# Patient Record
Sex: Male | Born: 1970 | Race: Black or African American | Hispanic: No | Marital: Single | Smoking: Current every day smoker
Health system: Southern US, Community
[De-identification: ages and names within clinical notes are randomized; demographics above are authoritative.]

## PROBLEM LIST (undated history)

## (undated) ENCOUNTER — Emergency Department: Payer: Medicare HMO

## (undated) DIAGNOSIS — I10 Essential (primary) hypertension: Secondary | ICD-10-CM

## (undated) DIAGNOSIS — W3400XA Accidental discharge from unspecified firearms or gun, initial encounter: Secondary | ICD-10-CM

---

## 2002-05-26 DIAGNOSIS — W3400XA Accidental discharge from unspecified firearms or gun, initial encounter: Secondary | ICD-10-CM

## 2002-05-26 DIAGNOSIS — Y249XXA Unspecified firearm discharge, undetermined intent, initial encounter: Secondary | ICD-10-CM

## 2002-05-26 HISTORY — PX: OTHER SURGICAL HISTORY: SHX169

## 2002-05-26 HISTORY — DX: Unspecified firearm discharge, undetermined intent, initial encounter: Y24.9XXA

## 2002-05-26 HISTORY — DX: Accidental discharge from unspecified firearms or gun, initial encounter: W34.00XA

## 2007-02-07 ENCOUNTER — Emergency Department: Payer: Self-pay | Admitting: Internal Medicine

## 2007-02-10 ENCOUNTER — Emergency Department: Payer: Self-pay | Admitting: Emergency Medicine

## 2009-04-26 ENCOUNTER — Ambulatory Visit: Payer: Self-pay | Admitting: Internal Medicine

## 2010-10-07 ENCOUNTER — Emergency Department: Payer: Self-pay | Admitting: Emergency Medicine

## 2011-01-07 ENCOUNTER — Emergency Department: Payer: Self-pay | Admitting: Emergency Medicine

## 2011-09-27 ENCOUNTER — Inpatient Hospital Stay: Payer: Self-pay | Admitting: Psychiatry

## 2011-09-27 LAB — COMPREHENSIVE METABOLIC PANEL
Albumin: 3.8 g/dL (ref 3.4–5.0)
Alkaline Phosphatase: 57 U/L (ref 50–136)
Anion Gap: 4 — ABNORMAL LOW (ref 7–16)
BUN: 11 mg/dL (ref 7–18)
Chloride: 106 mmol/L (ref 98–107)
Creatinine: 1 mg/dL (ref 0.60–1.30)
EGFR (Non-African Amer.): >60
Osmolality: 275 (ref 275–301)
SGOT(AST): 16 U/L (ref 15–37)
Sodium: 138 mmol/L (ref 136–145)
Total Protein: 7.2 g/dL (ref 6.4–8.2)

## 2011-09-27 LAB — DRUG SCREEN, URINE
Amphetamines, Ur Screen: NEGATIVE (ref ?–1000)
Barbiturates, Ur Screen: NEGATIVE (ref ?–200)
Benzodiazepine, Ur Scrn: NEGATIVE (ref ?–200)
Cannabinoid 50 Ng, Ur ~~LOC~~: NEGATIVE (ref ?–50)
Cocaine Metabolite,Ur ~~LOC~~: POSITIVE (ref ?–300)
MDMA (Ecstasy)Ur Screen: NEGATIVE (ref ?–500)
Opiate, Ur Screen: NEGATIVE (ref ?–300)

## 2011-09-27 LAB — SALICYLATE LEVEL: Salicylates, Serum: <1.7 mg/dL

## 2011-09-27 LAB — CBC
HGB: 14.3 g/dL (ref 13.0–18.0)
MCV: 92 fL (ref 80–100)
Platelet: 228 10*3/uL (ref 150–440)
RDW: 14 % (ref 11.5–14.5)

## 2011-09-27 LAB — ETHANOL: Ethanol %: <0.003 % (ref 0.000–0.080)

## 2011-09-27 LAB — ACETAMINOPHEN LEVEL: Acetaminophen: <2 ug/mL

## 2012-06-22 ENCOUNTER — Observation Stay: Payer: Self-pay | Admitting: Internal Medicine

## 2012-06-22 LAB — CBC
MCHC: 33.6 g/dL (ref 32.0–36.0)
MCV: 91 fL (ref 80–100)
Platelet: 245 10*3/uL (ref 150–440)
RBC: 5.01 10*6/uL (ref 4.40–5.90)
RDW: 13.5 % (ref 11.5–14.5)

## 2012-06-22 LAB — DRUG SCREEN, URINE
Benzodiazepine, Ur Scrn: NEGATIVE (ref ?–200)
Cannabinoid 50 Ng, Ur ~~LOC~~: NEGATIVE (ref ?–50)
Cocaine Metabolite,Ur ~~LOC~~: NEGATIVE (ref ?–300)
MDMA (Ecstasy)Ur Screen: NEGATIVE (ref ?–500)
Methadone, Ur Screen: NEGATIVE (ref ?–300)
Opiate, Ur Screen: NEGATIVE (ref ?–300)

## 2012-06-22 LAB — BASIC METABOLIC PANEL
Calcium, Total: 9.3 mg/dL (ref 8.5–10.1)
Chloride: 107 mmol/L (ref 98–107)
Co2: 30 mmol/L (ref 21–32)
EGFR (Non-African Amer.): >60
Glucose: 89 mg/dL (ref 65–99)
Osmolality: 280 (ref 275–301)

## 2013-05-21 IMAGING — CR DG SHOULDER 3+V*L*
1 series · 3 of 3 positions shown · non-contrast
Comparison: none

REASON FOR EXAM: pain lrft shoulder
COMMENTS:

PROCEDURE:     DXR - DXR SHOULDER LEFT COMPLETE  - September 27, 2011  [DATE]
RESULT:     Three views of the left shoulder are submitted. The bones appear
adequately mineralized. I see no evidence of fracture nor dislocation. The
overlying soft tissues are normal in appearance.

[Series 1: internal rotate · 0.17mm/px · 3 of 3 slices shown]
[im 1/3]
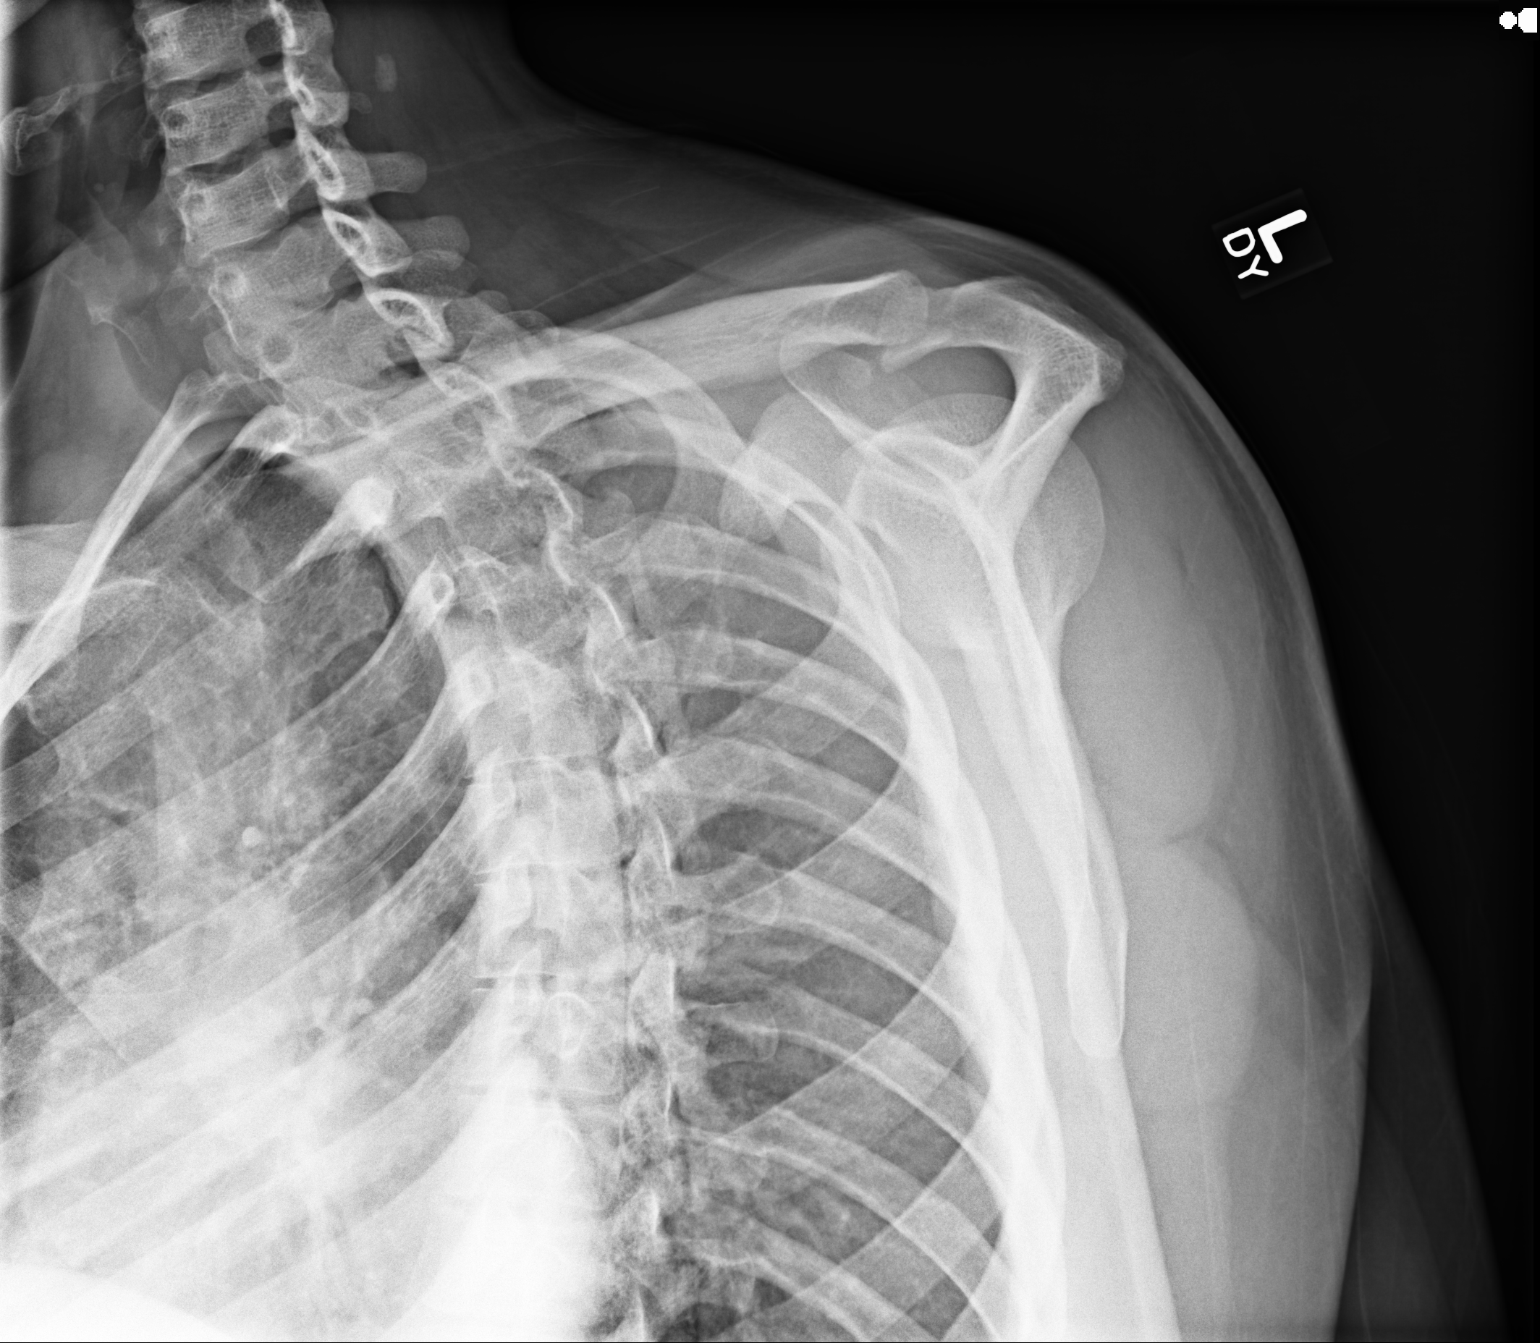
[im 2/3]
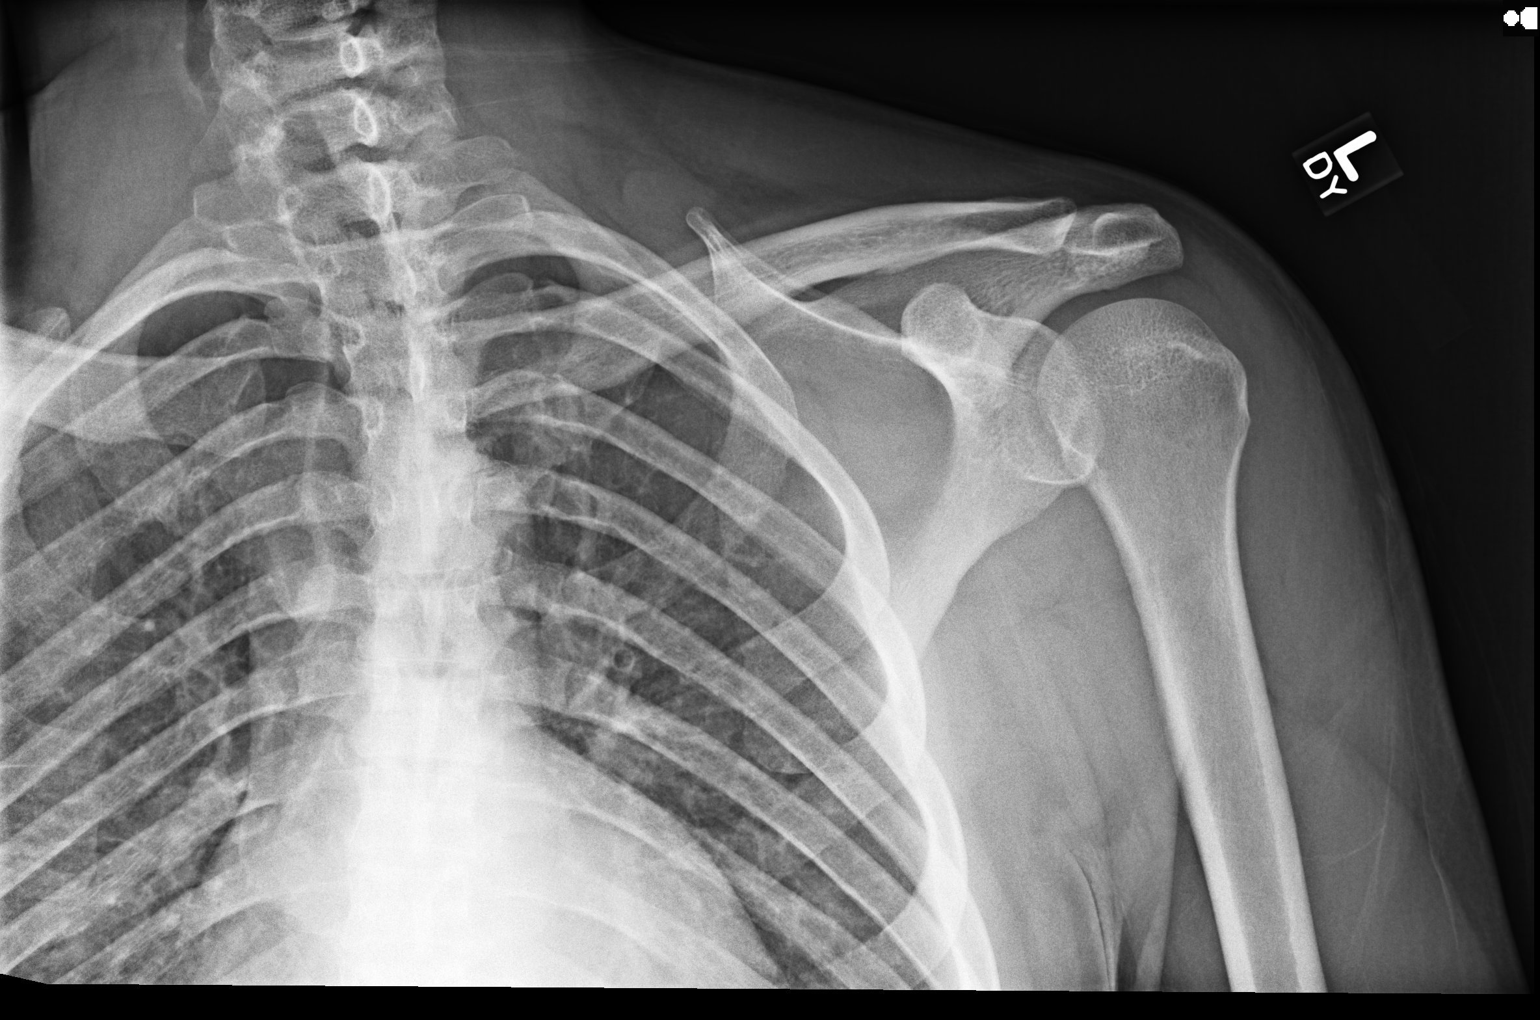
[im 3/3]
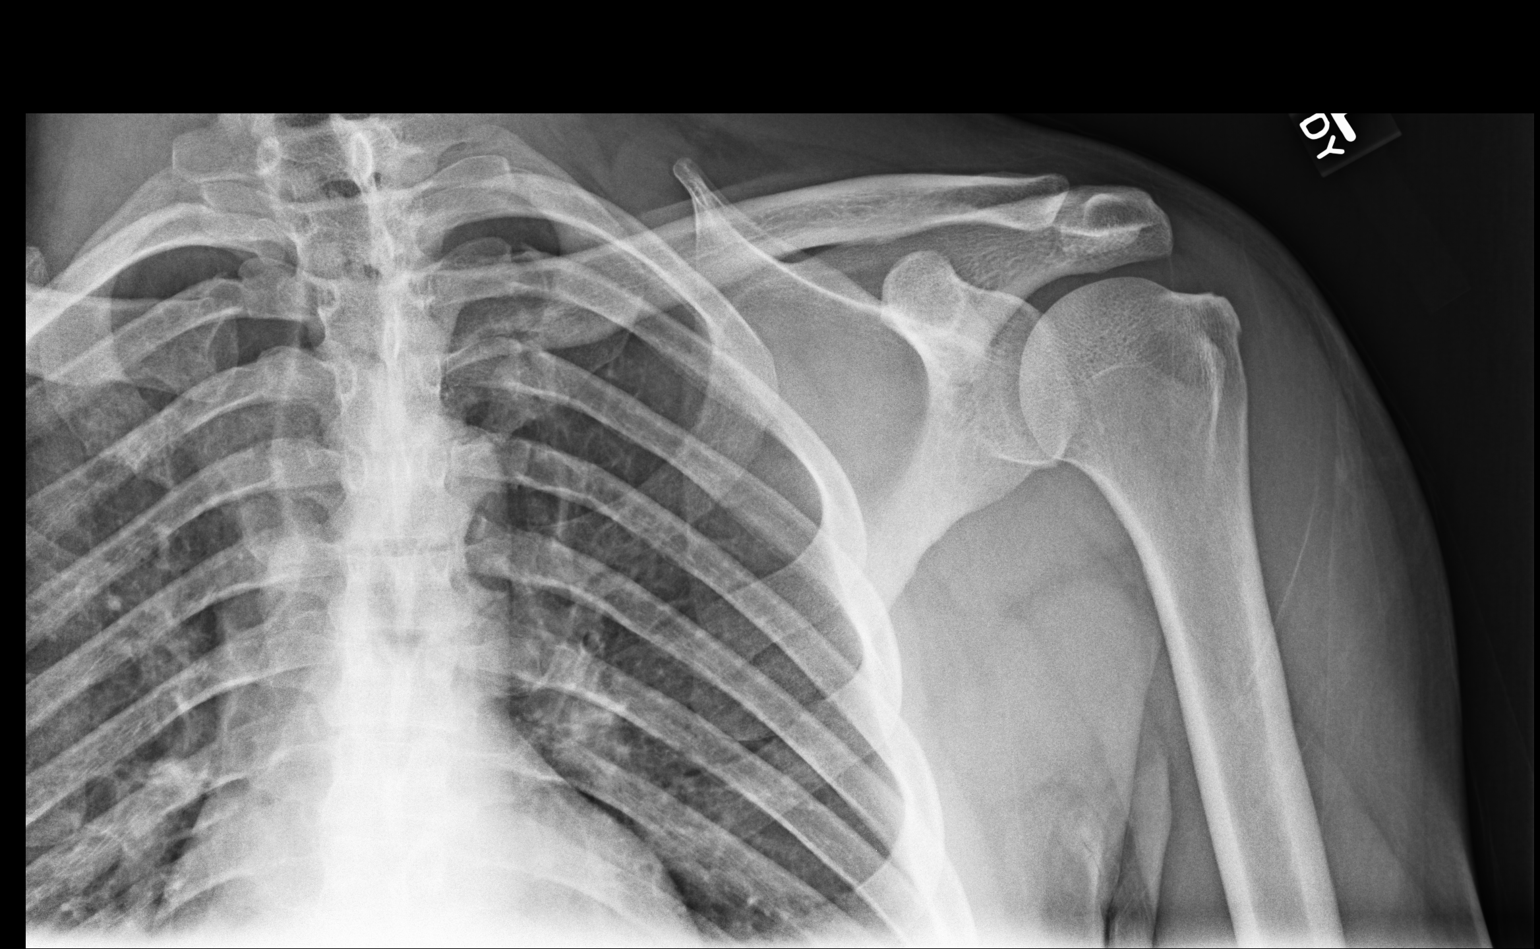

[3 of 3 positions shown; findings below may reference images not displayed]

IMPRESSION: I see no acute bony abnormality of the left shoulder.

[REDACTED]

## 2014-02-14 IMAGING — CT CT HEAD WITHOUT CONTRAST
1 series · 16 of 30 positions shown, 20 images · non-contrast
Comparison: none

REASON FOR EXAM: l sided numbness
COMMENTS:

PROCEDURE:     CT  - CT HEAD WITHOUT CONTRAST  - June 22, 2012  [DATE]
RESULT:     Comparison:  None
TECHNIQUE: Multiple axial images from the foramen magnum to the vertex were
obtained without IV contrast.

[Series 2: soft tissue · axial · 0.41mm/px · z∈[-139,-4]mm · 16 of 31 slices shown, 20 images]
[im 2/31  brain]
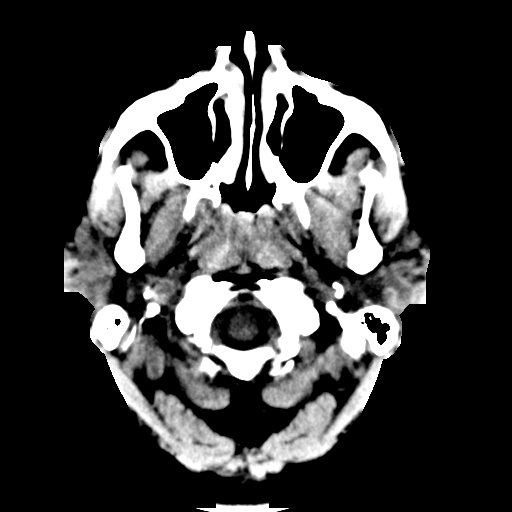
[im 2/31  bone]
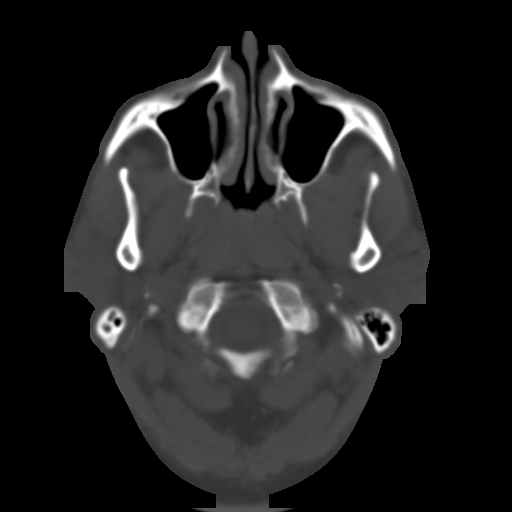
[im 4/31  brain]
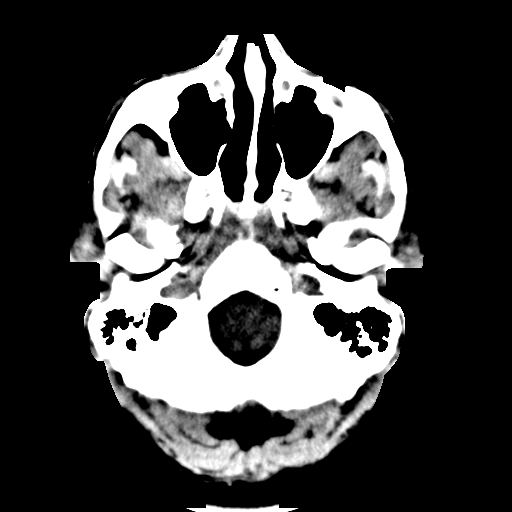
[im 6/31  brain]
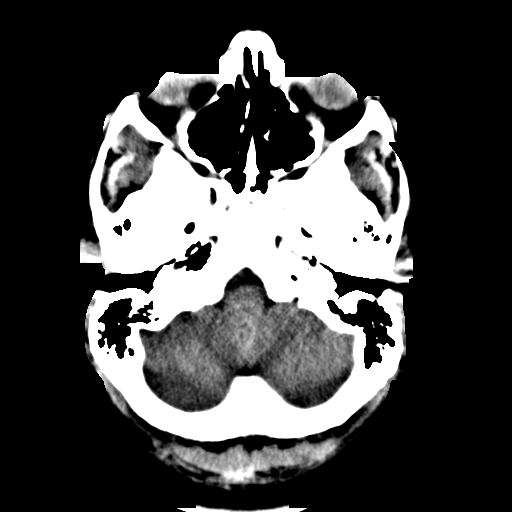
[im 8/31  brain]
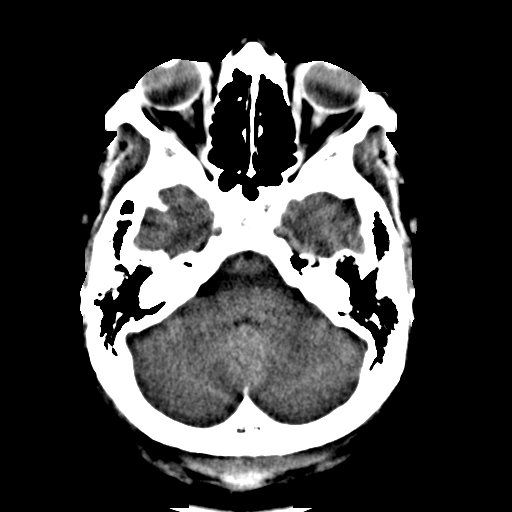
[im 9/31  brain]
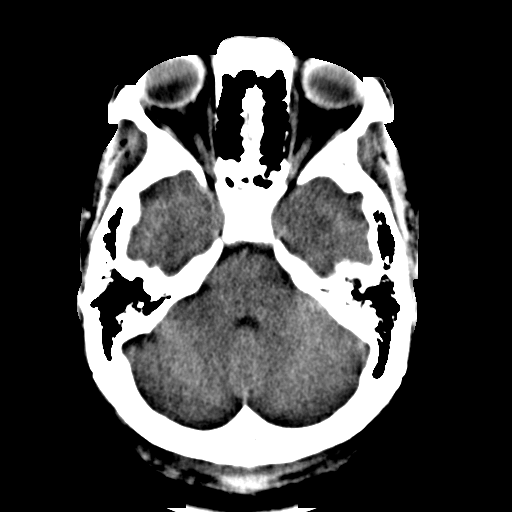
[im 9/31  bone]
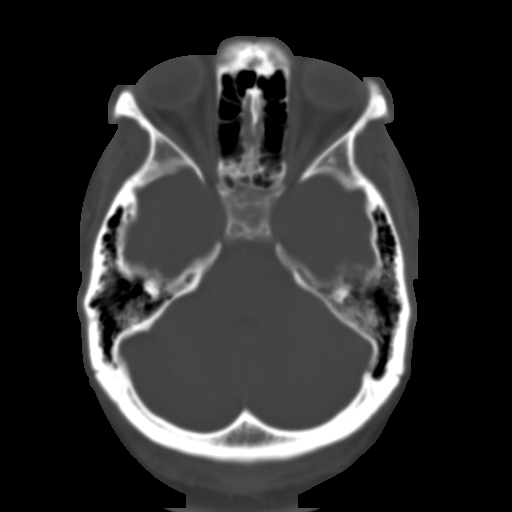
[im 11/31  brain]
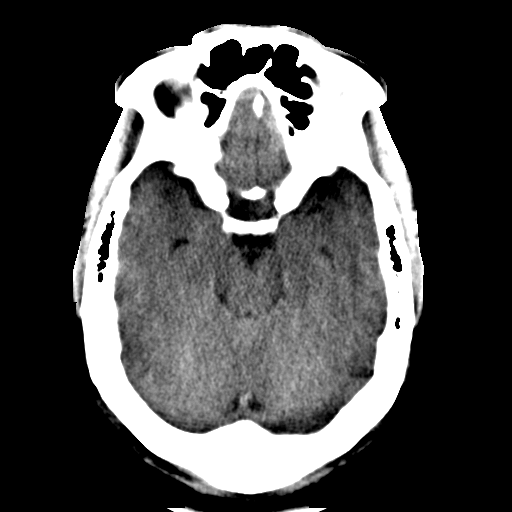
[im 13/31  brain]
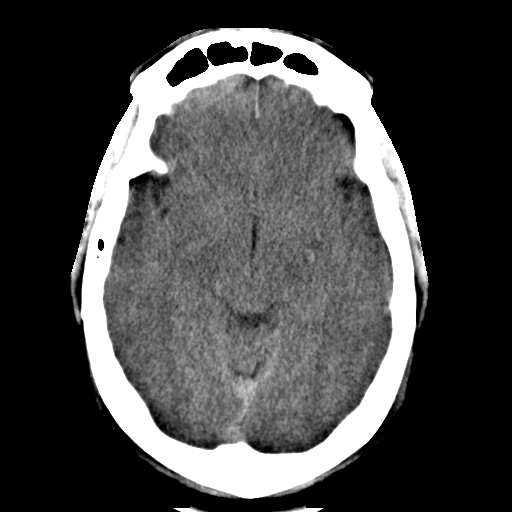
[im 15/31  brain]
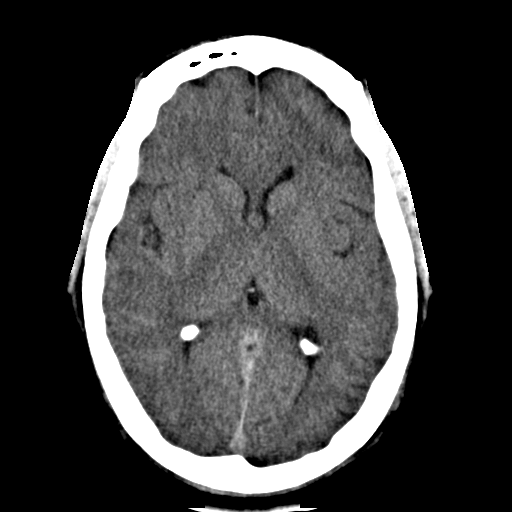
[im 16/31  brain]
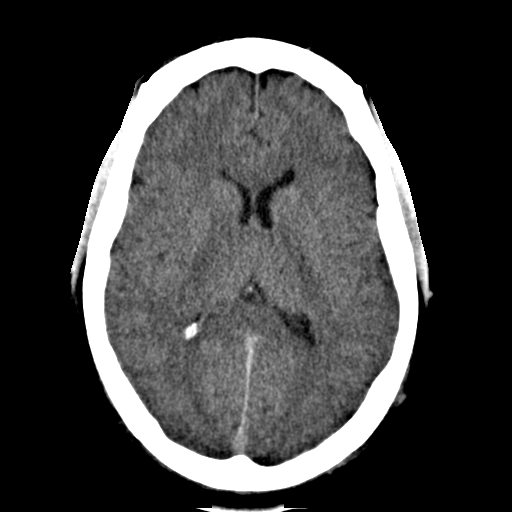
[im 16/31  bone]
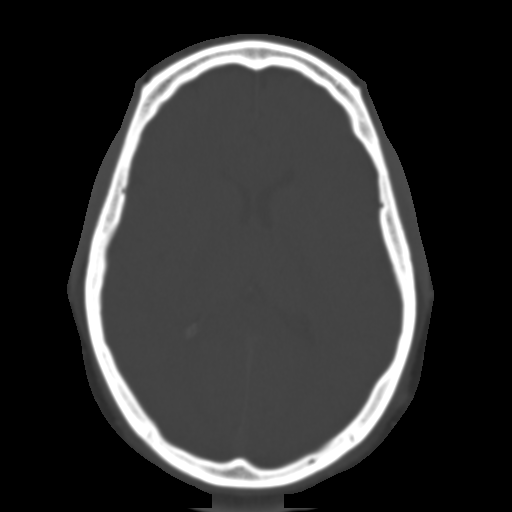
[im 18/31  brain]
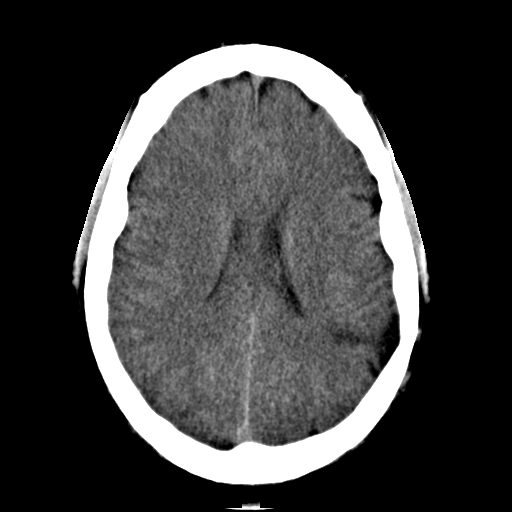
[im 20/31  brain]
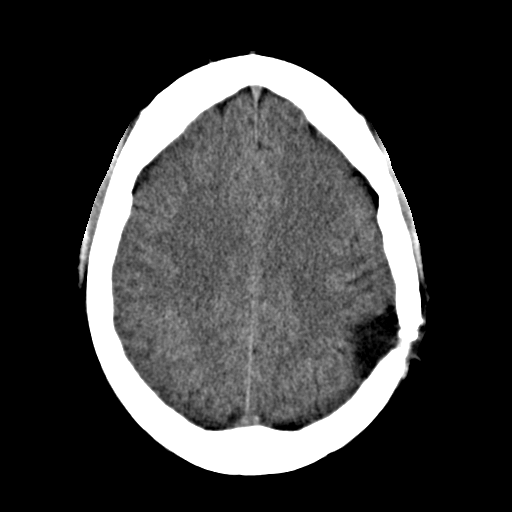
[im 22/31  brain]
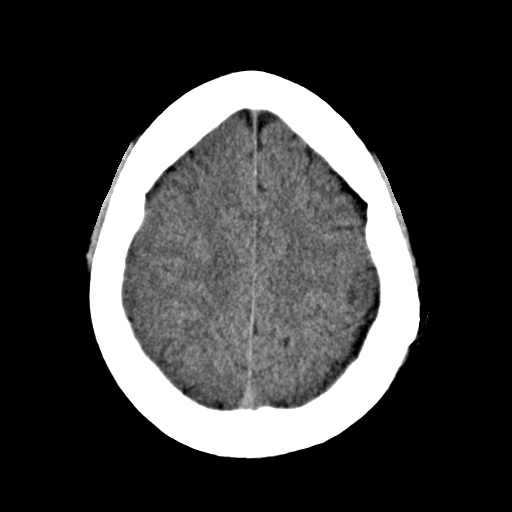
[im 23/31  brain]
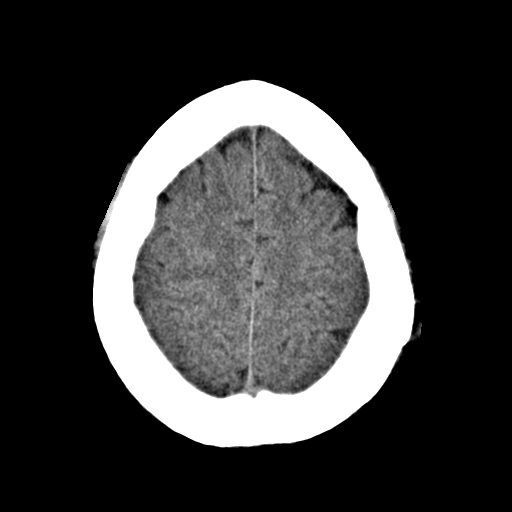
[im 23/31  bone]
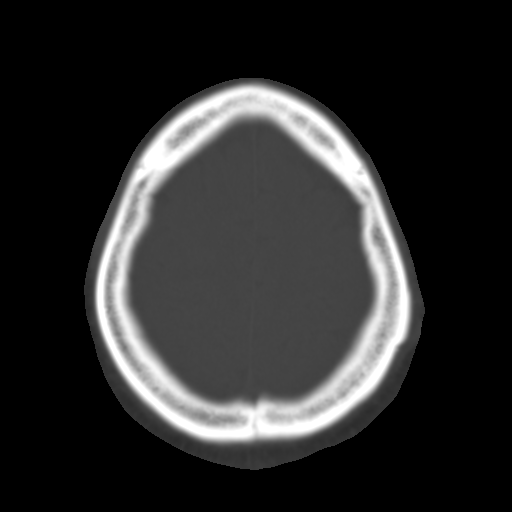
[im 25/31  brain]
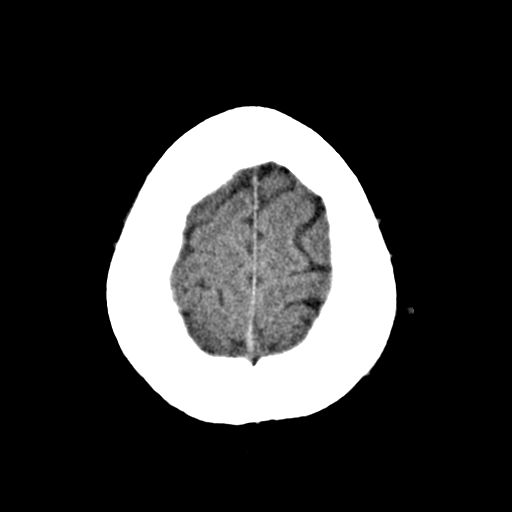
[im 27/31  brain]
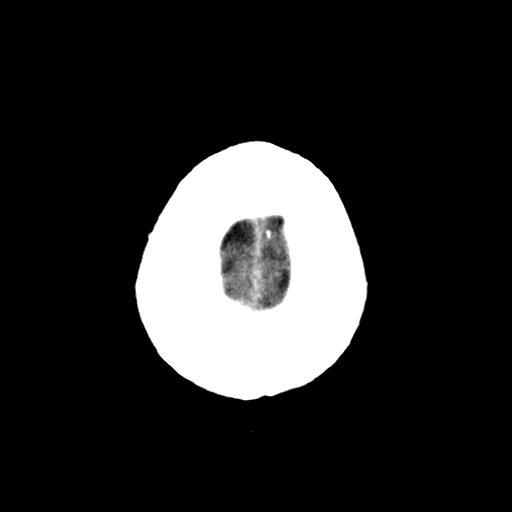
[im 29/31  brain]
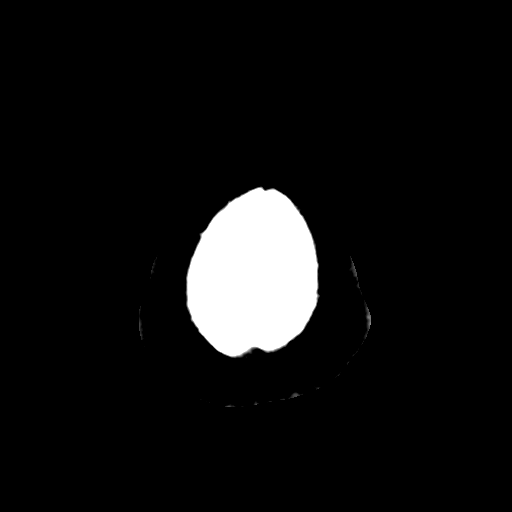

[16 of 30 positions shown; findings below may reference images not displayed]

FINDINGS: There is no evidence of mass effect, midline shift, or extra-axial fluid
collections.  There is no evidence of a space-occupying lesion or
intracranial hemorrhage. There is no evidence of a cortical-based area of
acute infarction. There is left parietal encephalomalacia from prior insult.
There is a left parietal calvarial defect.

The ventricles and sulci are appropriate for the patient's age. The basal
cisterns are patent.

Visualized portions of the orbits are unremarkable. The visualized portions
of the paranasal sinuses and mastoid air cells are unremarkable.

The osseous structures are otherwise unremarkable.
IMPRESSION: No acute intracranial process.

[REDACTED]

## 2014-04-07 ENCOUNTER — Emergency Department: Payer: Self-pay | Admitting: Emergency Medicine
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
## 2014-10-09 ENCOUNTER — Other Ambulatory Visit: Payer: Self-pay

## 2014-10-09 ENCOUNTER — Emergency Department
Admission: EM | Admit: 2014-10-09 | Discharge: 2014-10-10 | Disposition: A | Discharge status: Home / Self Care | Payer: Medicare Other | Attending: Emergency Medicine | Admitting: Emergency Medicine

## 2014-10-09 ENCOUNTER — Encounter: Payer: Self-pay | Admitting: Emergency Medicine

## 2014-10-09 DIAGNOSIS — W1830XA Fall on same level, unspecified, initial encounter: Secondary | ICD-10-CM | POA: Insufficient documentation

## 2014-10-09 DIAGNOSIS — Z720 Tobacco use: Secondary | ICD-10-CM | POA: Diagnosis not present

## 2014-10-09 DIAGNOSIS — Y9301 Activity, walking, marching and hiking: Secondary | ICD-10-CM | POA: Diagnosis not present

## 2014-10-09 DIAGNOSIS — S8991XA Unspecified injury of right lower leg, initial encounter: Secondary | ICD-10-CM | POA: Insufficient documentation

## 2014-10-09 DIAGNOSIS — I10 Essential (primary) hypertension: Secondary | ICD-10-CM | POA: Diagnosis not present

## 2014-10-09 DIAGNOSIS — R252 Cramp and spasm: Secondary | ICD-10-CM

## 2014-10-09 DIAGNOSIS — Y998 Other external cause status: Secondary | ICD-10-CM | POA: Insufficient documentation

## 2014-10-09 DIAGNOSIS — Z79899 Other long term (current) drug therapy: Secondary | ICD-10-CM | POA: Insufficient documentation

## 2014-10-09 DIAGNOSIS — Y9289 Other specified places as the place of occurrence of the external cause: Secondary | ICD-10-CM | POA: Insufficient documentation

## 2014-10-09 HISTORY — DX: Essential (primary) hypertension: I10

## 2014-10-09 HISTORY — DX: Accidental discharge from unspecified firearms or gun, initial encounter: W34.00XA

## 2014-10-09 LAB — BASIC METABOLIC PANEL
ANION GAP: 8 (ref 5–15)
BUN: 17 mg/dL (ref 6–20)
CALCIUM: 9.5 mg/dL (ref 8.9–10.3)
CO2: 29 mmol/L (ref 22–32)
CREATININE: 1.31 mg/dL — AB (ref 0.61–1.24)
Chloride: 105 mmol/L (ref 101–111)
GFR calc Af Amer: >60 mL/min (ref >60)
GFR calc non Af Amer: >60 mL/min (ref >60)
Glucose, Bld: 90 mg/dL (ref 65–99)
Potassium: 3.9 mmol/L (ref 3.5–5.1)
SODIUM: 142 mmol/L (ref 135–145)

## 2014-10-09 MED ORDER — IBUPROFEN 800 MG PO TABS
800.0000 mg | ORAL_TABLET | ORAL | Status: AC
  Administered 2014-10-09: 800 mg via ORAL

## 2014-10-09 MED ORDER — ACETAMINOPHEN 500 MG PO TABS
ORAL_TABLET | ORAL | Status: AC
  Administered 2014-10-09: 1000 mg via ORAL
  Filled 2014-10-09: qty 2

## 2014-10-09 MED ORDER — IBUPROFEN 800 MG PO TABS
ORAL_TABLET | ORAL | Status: AC
  Administered 2014-10-09: 800 mg via ORAL
  Filled 2014-10-09: qty 1

## 2014-10-09 MED ORDER — LISINOPRIL 5 MG PO TABS: 5.0000 mg | ORAL_TABLET | Freq: Every day | ORAL | Status: DC

## 2014-10-09 MED ORDER — ACETAMINOPHEN 500 MG PO TABS
1000.0000 mg | ORAL_TABLET | ORAL | Status: AC
  Administered 2014-10-09: 1000 mg via ORAL

## 2014-10-09 NOTE — ED Notes (Addendum)
Pt arrived via EMS from home/site. Per EMS patient was walking and had a muscle spasm and fell. Per family, pt fell to the ground due to weakness. Pt alert and oriented on arrival.

## 2014-10-09 NOTE — Discharge Instructions (Signed)
Hypertension  Please follow up with the [facility]. Do not use any medications except those which are prescribed to you. Return to the ER right away should you have return of severe pain in the leg, weakness in the leg, if cold he walking, numbness, tingling, or other new concerns or symptoms arise.  Hypertension is another name for high blood pressure. High blood pressure forces your heart to work harder to pump blood. A blood pressure reading has two numbers, which includes a higher number over a lower number (example: 110/72). HOME CARE   Have your blood pressure rechecked by your doctor.  Only take medicine as told by your doctor. Follow the directions carefully. The medicine does not work as well if you skip doses. Skipping doses also puts you at risk for problems.  Do not smoke.  Monitor your blood pressure at home as told by your doctor. GET HELP IF:  You think you are having a reaction to the medicine you are taking.  You have repeat headaches or feel dizzy.  You have puffiness (swelling) in your ankles.  You have trouble with your vision. GET HELP RIGHT AWAY IF:   You get a very bad headache and are confused.  You feel weak, numb, or faint.  You get chest or belly (abdominal) pain.  You throw up (vomit).  You cannot breathe very well. MAKE SURE YOU:   Understand these instructions.  Will watch your condition.  Will get help right away if you are not doing well or get worse. Document Released: 10/29/2007 Document Revised: 05/17/2013 Document Reviewed: 03/04/2013 [facility] Patient Information 2015 [place], [place]. This information is not intended to replace advice given to you by your health care provider. Make sure you discuss any questions you have with your health care provider.  Leg Cramps Leg cramps that occur during exercise can be caused by poor circulation or dehydration. However, muscle cramps that occur at rest or during the night are usually not  due to any serious medical problem. Heat cramps may cause muscle spasms during hot weather.  CAUSES There is no clear cause for muscle cramps. However, dehydration may be a factor for those who do not drink enough fluids and those who exercise in the heat. Imbalances in the level of sodium, potassium, calcium or magnesium in the muscle tissue may also be a factor. Some medications, such as water pills (diuretics), may cause loss of chemicals that the body needs (like sodium and potassium) and cause muscle cramps. TREATMENT   Make sure your diet has enough fluids and essential minerals for the muscle to work normally.  Avoid strenuous exercise for several days if you have been having frequent leg cramps.  Stretch and massage the cramped muscle for several minutes.  Some medicines may be helpful in some patients with night cramps. Only take over-the-counter or prescription medicines as directed by your caregiver. SEEK IMMEDIATE MEDICAL CARE IF:   Your leg cramps become worse.  Your foot becomes cold, numb, or blue. Document Released: 06/19/2004 Document Revised: 08/04/2011 Document Reviewed: 06/06/2008 [facility] Patient Information 2015 [place], [place]. This information is not intended to replace advice given to you by your health care provider. Make sure you discuss any questions you have with your health care provider.
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
## 2014-10-10 NOTE — ED Provider Notes (Signed)
-----------------------------------------   12:49 AM on 10/10/2014 -----------------------------------------  Assuming care from Dr. Fanny BienQuale.  In short, Troy Wiley is a 44 y.o. male with a chief complaint of Weakness .  Refer to the original H&P for additional details.  The current plan of care is to follow-up the results of the patient's basic metabolic panel.  The patient has a creatinine of 1.31 but the remainder of his basic metabolic panel is unremarkable. I'll discharge the patient home to have him follow up with the primary care physician.   Rebecka ApleyAllison P Sharin Altidor, MD 10/10/14 [phone]

## 2018-01-27 ENCOUNTER — Emergency Department
Admission: EM | Admit: 2018-01-27 | Discharge: 2018-01-28 | Disposition: A | Discharge status: Home / Self Care | Payer: Medicare Other | Attending: Student in an Organized Health Care Education/Training Program | Admitting: Student in an Organized Health Care Education/Training Program

## 2018-01-27 ENCOUNTER — Other Ambulatory Visit: Payer: Self-pay

## 2018-01-27 DIAGNOSIS — F1410 Cocaine abuse, uncomplicated: Secondary | ICD-10-CM | POA: Diagnosis not present

## 2018-01-27 DIAGNOSIS — F329 Major depressive disorder, single episode, unspecified: Secondary | ICD-10-CM | POA: Insufficient documentation

## 2018-01-27 DIAGNOSIS — F17210 Nicotine dependence, cigarettes, uncomplicated: Secondary | ICD-10-CM | POA: Diagnosis not present

## 2018-01-27 DIAGNOSIS — I10 Essential (primary) hypertension: Secondary | ICD-10-CM | POA: Diagnosis not present

## 2018-01-27 DIAGNOSIS — G8929 Other chronic pain: Secondary | ICD-10-CM

## 2018-01-27 DIAGNOSIS — R45851 Suicidal ideations: Secondary | ICD-10-CM | POA: Diagnosis not present

## 2018-01-27 DIAGNOSIS — F1910 Other psychoactive substance abuse, uncomplicated: Secondary | ICD-10-CM | POA: Diagnosis not present

## 2018-01-27 LAB — COMPREHENSIVE METABOLIC PANEL
ALK PHOS: 71 U/L (ref 38–126)
ALT: 18 U/L (ref 0–44)
AST: 17 U/L (ref 15–41)
Albumin: 4.7 g/dL (ref 3.5–5.0)
Anion gap: 9 (ref 5–15)
BUN: 16 mg/dL (ref 6–20)
CALCIUM: 9.7 mg/dL (ref 8.9–10.3)
CO2: 26 mmol/L (ref 22–32)
CREATININE: 1.26 mg/dL — AB (ref 0.61–1.24)
Chloride: 104 mmol/L (ref 98–111)
GFR calc Af Amer: >60 mL/min (ref >60)
Glucose, Bld: 126 mg/dL — ABNORMAL HIGH (ref 70–99)
Potassium: 4 mmol/L (ref 3.5–5.1)
Sodium: 139 mmol/L (ref 135–145)
Total Bilirubin: 0.8 mg/dL (ref 0.3–1.2)
Total Protein: 8.3 g/dL — ABNORMAL HIGH (ref 6.5–8.1)

## 2018-01-27 LAB — CBC
HCT: 49.4 % (ref 40.0–52.0)
HEMOGLOBIN: 16.5 g/dL (ref 13.0–18.0)
MCH: 30.4 pg (ref 26.0–34.0)
MCHC: 33.4 g/dL (ref 32.0–36.0)
MCV: 90.8 fL (ref 80.0–100.0)
PLATELETS: 302 10*3/uL (ref 150–440)
RBC: 5.44 MIL/uL (ref 4.40–5.90)
RDW: 14.1 % (ref 11.5–14.5)
WBC: 11.6 10*3/uL — ABNORMAL HIGH (ref 3.8–10.6)

## 2018-01-27 LAB — ETHANOL

## 2018-01-27 LAB — SALICYLATE LEVEL: Salicylate Lvl: <7 mg/dL (ref 2.8–30.0)

## 2018-01-27 LAB — ACETAMINOPHEN LEVEL: Acetaminophen (Tylenol), Serum: <10 ug/mL — ABNORMAL LOW (ref 10–30)

## 2018-01-27 MED ORDER — CITALOPRAM HYDROBROMIDE 20 MG PO TABS
10.0000 mg | ORAL_TABLET | Freq: Every day | ORAL | Status: DC
  Administered 2018-01-28: 10 mg via ORAL
  Filled 2018-01-27: qty 1

## 2018-01-27 NOTE — ED Notes (Signed)
SOC called to [employer].

## 2018-01-27 NOTE — ED Notes (Signed)
Sister, Lelon Mast that is here with patient is very supportive and willing to help patient get detox treatment.

## 2018-01-27 NOTE — ED Triage Notes (Signed)
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 Having suicidal thoughts x 2 days. No plan, no hx of SI attempts. Denies HI.

## 2018-01-27 NOTE — ED Notes (Signed)
Pt given sandwich tray and drink. 

## 2018-01-27 NOTE — ED Notes (Signed)

## 2018-01-27 NOTE — ED Notes (Signed)
This RN and Dorian EDT at bedside while patient changes into hospital clothing.

## 2018-01-27 NOTE — ED Notes (Signed)
Hourly rounding reveals patient sleeping in room. No complaints, stable, in no acute distress. Q15 minute rounds and monitoring via Security Cameras to continue. 

## 2018-01-27 NOTE — ED Notes (Signed)
Pt. Transferred to [facility] from ED to room 3 after screening for contraband. Report to include Situation, Background, Assessment and Recommendations from Kenisha RN. Pt. Oriented to unit including Q15 minute rounds as well as the security cameras for their protection. Patient is alert and oriented, warm and dry in no acute distress. Patient denies SI, HI, and AVH. Pt. Encouraged to let me know if needs arise. 

## 2018-01-27 NOTE — ED Notes (Signed)
SOC camera in room.
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
## 2018-01-27 NOTE — ED Notes (Signed)
Tennis shoes, jean shorts, black shirt, green tank top, camo underwear, grey belt sent home with [phone] Peter Minium)

## 2018-01-27 NOTE — ED Notes (Signed)
Hourly rounding reveals patient in room. No complaints, stable, in no acute distress. Q15 minute rounds and monitoring via Security Cameras to continue. 

## 2018-01-28 DIAGNOSIS — G8929 Other chronic pain: Secondary | ICD-10-CM

## 2018-01-28 DIAGNOSIS — F1410 Cocaine abuse, uncomplicated: Secondary | ICD-10-CM

## 2018-01-28 NOTE — ED Notes (Signed)
Hourly rounding reveals patient sleeping in room. No complaints, stable, in no acute distress. Q15 minute rounds and monitoring via Security Cameras to continue. 
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
## 2018-01-28 NOTE — ED Notes (Signed)
IVC/Consult completed/ Pending placement 

## 2018-01-28 NOTE — ED Provider Notes (Signed)
-----------------------------------------   4:59 PM on 01/28/2018 -----------------------------------------   Blood pressure (!) 149/88, pulse 66, temperature 97.7 F (36.5 C), temperature source Oral, resp. rate 16, height 5\' 9"  (1.753 m), weight 109 kg, SpO2 100 %.  The patient had no acute events since last update.  Calm and cooperative at this time.  Patient clinically sober.  No suicidal or homicidal ideation at this time.  Evaluated by Dr. Toni Amend and deemed appropriate for discharge home with follow-up with [facility].    Myrna Blazer, MD 01/28/18 [phone]

## 2018-01-28 NOTE — ED Provider Notes (Signed)
-----------------------------------------   6:42 AM on 01/28/2018 -----------------------------------------   Blood pressure (!) 149/88, pulse 66, temperature 97.7 F (36.5 C), temperature source Oral, resp. rate 16, height 5\' 9"  (1.753 m), weight 109 kg, SpO2 100 %.  The patient had no acute events since last update.  Calm and cooperative at this time.  Disposition is pending Psychiatry/Behavioral Medicine team recommendations.     Irean Hong, MD 01/28/18 [phone]

## 2018-01-28 NOTE — ED Notes (Addendum)
Patient discharged home, patient received discharge papers. Patient received belongings and verbalized she has received all of her belongings. Patient appropriate and cooperative, Denies SI/HI AVH. Vital signs taken. NAD noted. 
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
## 2018-01-28 NOTE — BH Assessment (Signed)
Per [facility] pt meets inpatient criteria. TTS consult ordered at 2:24am, pt sleeping comfortably. TTS will return to complete assessment when pt is awake and appropriate to complete assessment.

## 2018-09-24 DIAGNOSIS — G44329 Chronic post-traumatic headache, not intractable: Secondary | ICD-10-CM | POA: Diagnosis present

## 2019-01-03 ENCOUNTER — Encounter: Payer: Self-pay | Admitting: Emergency Medicine

## 2019-01-03 ENCOUNTER — Emergency Department
Admission: EM | Admit: 2019-01-03 | Discharge: 2019-01-04 | Disposition: A | Discharge status: Other Acute Inpatient Hospital | Payer: Medicare Other | Attending: Emergency Medicine | Admitting: Emergency Medicine

## 2019-01-03 ENCOUNTER — Other Ambulatory Visit: Payer: Self-pay

## 2019-01-03 DIAGNOSIS — F329 Major depressive disorder, single episode, unspecified: Secondary | ICD-10-CM

## 2019-01-03 DIAGNOSIS — F32A Depression, unspecified: Secondary | ICD-10-CM

## 2019-01-03 DIAGNOSIS — Z046 Encounter for general psychiatric examination, requested by authority: Secondary | ICD-10-CM | POA: Diagnosis present

## 2019-01-03 DIAGNOSIS — I10 Essential (primary) hypertension: Secondary | ICD-10-CM | POA: Diagnosis not present

## 2019-01-03 DIAGNOSIS — F17210 Nicotine dependence, cigarettes, uncomplicated: Secondary | ICD-10-CM | POA: Diagnosis not present

## 2019-01-03 DIAGNOSIS — F332 Major depressive disorder, recurrent severe without psychotic features: Secondary | ICD-10-CM | POA: Diagnosis not present

## 2019-01-03 DIAGNOSIS — Z20828 Contact with and (suspected) exposure to other viral communicable diseases: Secondary | ICD-10-CM | POA: Diagnosis not present

## 2019-01-03 DIAGNOSIS — Z79899 Other long term (current) drug therapy: Secondary | ICD-10-CM | POA: Insufficient documentation

## 2019-01-03 LAB — SALICYLATE LEVEL: Salicylate Lvl: <7 mg/dL (ref 2.8–30.0)

## 2019-01-03 LAB — ACETAMINOPHEN LEVEL: Acetaminophen (Tylenol), Serum: <10 ug/mL — ABNORMAL LOW (ref 10–30)

## 2019-01-03 LAB — CBC
HCT: 48.4 % (ref 39.0–52.0)
Hemoglobin: 15.8 g/dL (ref 13.0–17.0)
MCH: 30.4 pg (ref 26.0–34.0)
MCHC: 32.6 g/dL (ref 30.0–36.0)
MCV: 93.1 fL (ref 80.0–100.0)
Platelets: 315 10*3/uL (ref 150–400)
RBC: 5.2 MIL/uL (ref 4.22–5.81)
RDW: 13.1 % (ref 11.5–15.5)
WBC: 16.7 10*3/uL — ABNORMAL HIGH (ref 4.0–10.5)
nRBC: 0 % (ref 0.0–0.2)

## 2019-01-03 LAB — URINE DRUG SCREEN, QUALITATIVE (ARMC ONLY)
Amphetamines, Ur Screen: NOT DETECTED
Barbiturates, Ur Screen: NOT DETECTED
Benzodiazepine, Ur Scrn: POSITIVE — AB
Cannabinoid 50 Ng, Ur ~~LOC~~: NOT DETECTED
Cocaine Metabolite,Ur ~~LOC~~: POSITIVE — AB
MDMA (Ecstasy)Ur Screen: NOT DETECTED
Methadone Scn, Ur: NOT DETECTED
Opiate, Ur Screen: NOT DETECTED
Phencyclidine (PCP) Ur S: NOT DETECTED
Tricyclic, Ur Screen: NOT DETECTED

## 2019-01-03 LAB — COMPREHENSIVE METABOLIC PANEL
ALT: 20 U/L (ref 0–44)
AST: 20 U/L (ref 15–41)
Albumin: 4.2 g/dL (ref 3.5–5.0)
Alkaline Phosphatase: 47 U/L (ref 38–126)
Anion gap: 9 (ref 5–15)
BUN: 15 mg/dL (ref 6–20)
CO2: 27 mmol/L (ref 22–32)
Calcium: 9.3 mg/dL (ref 8.9–10.3)
Chloride: 107 mmol/L (ref 98–111)
Creatinine, Ser: 1.34 mg/dL — ABNORMAL HIGH (ref 0.61–1.24)
GFR calc Af Amer: >60 mL/min (ref >60)
GFR calc non Af Amer: >60 mL/min (ref >60)
Glucose, Bld: 100 mg/dL — ABNORMAL HIGH (ref 70–99)
Potassium: 3.9 mmol/L (ref 3.5–5.1)
Sodium: 143 mmol/L (ref 135–145)
Total Bilirubin: 0.8 mg/dL (ref 0.3–1.2)
Total Protein: 7.2 g/dL (ref 6.5–8.1)

## 2019-01-03 LAB — ETHANOL: Alcohol, Ethyl (B): <10 mg/dL (ref <10)

## 2019-01-03 NOTE — ED Triage Notes (Signed)
Pt arrived via EMS from [facility] where pt requested help for SI and detox from cocaine. Pt in triage sts, "im tired. I need help. I am suicidal, I want off cocaine. I need help." Pt is calm and cooperative. Pt denies HI.
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
## 2019-01-03 NOTE — ED Notes (Signed)
Pt. Transferred from Triage to hall 20 after dressing out and screening for contraband.  Pt. Oriented to Quad including Q15 minute rounds as well as Engineer, drilling for their protection. Patient is alert and oriented, warm and dry in no acute distress. Patient denies HI, and AVH. Pt. Encouraged to let me know if needs arise. Patient states he would use a knife to hurt himself.

## 2019-01-03 NOTE — ED Notes (Signed)
Patient in consult room talking to TTS.

## 2019-01-03 NOTE — ED Notes (Signed)
SOC called. 

## 2019-01-03 NOTE — ED Notes (Signed)
Hourly rounding reveals patient in hall bed. No complaints, stable, in no acute distress. Q15 minute rounds and monitoring via Rover and Officer to continue.  

## 2019-01-04 ENCOUNTER — Inpatient Hospital Stay (HOSPITAL_COMMUNITY)
Admission: AD | Admit: 2019-01-04 | Discharge: 2019-01-05 | DRG: 885 | Disposition: A | Discharge status: Other Acute Inpatient Hospital | Payer: Medicare Other | Attending: Psychiatry | Admitting: Psychiatry

## 2019-01-04 ENCOUNTER — Encounter (HOSPITAL_COMMUNITY): Payer: Self-pay

## 2019-01-04 ENCOUNTER — Other Ambulatory Visit (HOSPITAL_COMMUNITY): Payer: Self-pay | Admitting: Psychiatry

## 2019-01-04 DIAGNOSIS — I10 Essential (primary) hypertension: Secondary | ICD-10-CM | POA: Diagnosis present

## 2019-01-04 DIAGNOSIS — Z590 Homelessness: Secondary | ICD-10-CM

## 2019-01-04 DIAGNOSIS — F1420 Cocaine dependence, uncomplicated: Secondary | ICD-10-CM | POA: Diagnosis present

## 2019-01-04 DIAGNOSIS — G4700 Insomnia, unspecified: Secondary | ICD-10-CM | POA: Diagnosis present

## 2019-01-04 DIAGNOSIS — F17210 Nicotine dependence, cigarettes, uncomplicated: Secondary | ICD-10-CM | POA: Diagnosis present

## 2019-01-04 DIAGNOSIS — F332 Major depressive disorder, recurrent severe without psychotic features: Secondary | ICD-10-CM

## 2019-01-04 DIAGNOSIS — Z8249 Family history of ischemic heart disease and other diseases of the circulatory system: Secondary | ICD-10-CM

## 2019-01-04 DIAGNOSIS — G8929 Other chronic pain: Secondary | ICD-10-CM | POA: Diagnosis present

## 2019-01-04 DIAGNOSIS — F339 Major depressive disorder, recurrent, unspecified: Secondary | ICD-10-CM | POA: Diagnosis present

## 2019-01-04 DIAGNOSIS — R45851 Suicidal ideations: Secondary | ICD-10-CM | POA: Diagnosis present

## 2019-01-04 LAB — SARS CORONAVIRUS 2 BY RT PCR (HOSPITAL ORDER, PERFORMED IN ~~LOC~~ HOSPITAL LAB): SARS Coronavirus 2: NEGATIVE

## 2019-01-04 MED ORDER — NICOTINE 21 MG/24HR TD PT24
21.0000 mg | MEDICATED_PATCH | Freq: Every day | TRANSDERMAL | Status: DC
  Administered 2019-01-05: 21 mg via TRANSDERMAL
  Filled 2019-01-04 (×3): qty 1

## 2019-01-04 MED ORDER — CARVEDILOL 25 MG PO TABS
25.0000 mg | ORAL_TABLET | Freq: Two times a day (BID) | ORAL | Status: DC
  Administered 2019-01-04 – 2019-01-05 (×3): 25 mg via ORAL
  Filled 2019-01-04 (×6): qty 1

## 2019-01-04 MED ORDER — TRAZODONE HCL 50 MG PO TABS: 50.0000 mg | ORAL_TABLET | Freq: Every evening | ORAL | Status: DC | PRN

## 2019-01-04 MED ORDER — GABAPENTIN 300 MG PO CAPS
300.0000 mg | ORAL_CAPSULE | Freq: Three times a day (TID) | ORAL | Status: DC
  Administered 2019-01-04 – 2019-01-05 (×2): 300 mg via ORAL
  Filled 2019-01-04 (×8): qty 1

## 2019-01-04 MED ORDER — KETOROLAC TROMETHAMINE 60 MG/2ML IM SOLN
60.0000 mg | Freq: Once | INTRAMUSCULAR | Status: AC
  Administered 2019-01-04: 16:00:00 60 mg via INTRAMUSCULAR
  Filled 2019-01-04: qty 2

## 2019-01-04 MED ORDER — ALUM & MAG HYDROXIDE-SIMETH 200-200-20 MG/5ML PO SUSP: 30.0000 mL | ORAL | Status: DC | PRN

## 2019-01-04 MED ORDER — TEMAZEPAM 15 MG PO CAPS
30.0000 mg | ORAL_CAPSULE | Freq: Every day | ORAL | Status: DC
  Administered 2019-01-04: 30 mg via ORAL
  Filled 2019-01-04: qty 2

## 2019-01-04 MED ORDER — FLUOXETINE HCL 20 MG PO CAPS
20.0000 mg | ORAL_CAPSULE | Freq: Every day | ORAL | Status: DC
  Administered 2019-01-04 – 2019-01-05 (×2): 20 mg via ORAL
  Filled 2019-01-04 (×4): qty 1

## 2019-01-04 MED ORDER — MAGNESIUM HYDROXIDE 400 MG/5ML PO SUSP: 30.0000 mL | Freq: Every day | ORAL | Status: DC | PRN

## 2019-01-04 MED ORDER — ACETAMINOPHEN 325 MG PO TABS
650.0000 mg | ORAL_TABLET | Freq: Once | ORAL | Status: AC
  Administered 2019-01-04: 650 mg via ORAL
  Filled 2019-01-04: qty 2

## 2019-01-04 MED ORDER — ACETAMINOPHEN 325 MG PO TABS: 650.0000 mg | ORAL_TABLET | Freq: Four times a day (QID) | ORAL | Status: DC | PRN

## 2019-01-04 NOTE — ED Notes (Signed)
ED BHU [facility] Is the patient under IVC or is there intent for IVC: Yes.   Is the patient medically cleared: Yes.   Is there vacancy in the ED BHU: Yes.   Is the population mix appropriate for patient: Yes.   Is the patient awaiting placement in inpatient or outpatient setting: Yes.   Accepted for inpt placement at [facility] Has the patient had a psychiatric consult: Yes.   Survey of unit performed for contraband, proper placement and condition of furniture, tampering with fixtures in bathroom, shower, and each patient room: Yes.  ; Findings:  APPEARANCE/BEHAVIOR Calm and cooperative NEURO ASSESSMENT Orientation: oriented x3   Hallucinations: No.None noted (Hallucinations)  Denies  Speech: Normal Gait: normal RESPIRATORY ASSESSMENT Even  Unlabored respirations  CARDIOVASCULAR ASSESSMENT Pulses equal   regular rate  Skin warm and dry   GASTROINTESTINAL ASSESSMENT no GI complaint EXTREMITIES Full ROM  PLAN OF CARE Provide calm/safe environment. Vital signs assessed twice daily. ED BHU Assessment once each 12-hour shift.  Assure the ED provider has rounded once each shift. Provide and encourage hygiene. Provide redirection as needed. Assess for escalating behavior; address immediately and inform ED provider.  Assess family dynamic and appropriateness for visitation as needed: Yes.  ; If necessary, describe findings:  Educate the patient/family about BHU procedures/visitation: Yes.  ; If necessary, describe findings:

## 2019-01-04 NOTE — Care Management (Signed)
CMA sent referral to [facility].    CMA will follow up with Admissions.  CMA will notify CSW.      Punam Broussard Care Management Assistant  Email:[email or web address] Office: [phone]
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
## 2019-01-04 NOTE — ED Notes (Signed)
Pt being reviewed for possible admission to [facility]. H&P and Assessment has been provided to the BH Unit for the charge nurse to review and provide bed assignment.        

## 2019-01-04 NOTE — ED Notes (Signed)
Hourly rounding reveals patient in hall bed. No complaints, stable, in no acute distress. Q15 minute rounds and monitoring via Rover and Officer to continue.  

## 2019-01-04 NOTE — ED Notes (Signed)
[facility]  DEPT  CALLED FOR  TRANSPORT  TO  [facility] 
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
## 2019-01-04 NOTE — ED Provider Notes (Signed)
-----------------------------------------   4:55 AM on 01/04/2019 -----------------------------------------  Chronic pain, drug use to help with pain. Plan to SI with shooting self.   Blood pressure (!) 146/92, pulse 68, temperature 98.4 F (36.9 C), temperature source Oral, resp. rate 18, SpO2 100 %.  Pt Is asleep in bed.  Patient was given Tylenol earlier to help with a headache.  Patient is planning to be admitted downstairs and awaiting bed placement.   Vanessa [place], MD 01/04/19 [phone]

## 2019-01-04 NOTE — ED Notes (Signed)

## 2019-01-04 NOTE — Tx Team (Signed)
Initial Treatment Plan 01/04/2019 1:30 PM Troy Wiley ZES:[id number]    PATIENT STRESSORS: Substance abuse   PATIENT STRENGTHS: Agricultural engineer for treatment/growth   PATIENT IDENTIFIED PROBLEMS: "My drug use has gotten out of control"  "I want long term treatment.                    DISCHARGE CRITERIA:  Improved stabilization in mood, thinking, and/or behavior Verbal commitment to aftercare and medication compliance  PRELIMINARY DISCHARGE PLAN: Attend 12-step recovery group Return to previous living arrangement  PATIENT/FAMILY INVOLVEMENT: This treatment plan has been presented to and reviewed with the patient, Troy Wiley, and/or family member.  The patient and family have been given the opportunity to ask questions and make suggestions.  Clarita Crane, RN 01/04/2019, 1:30 PM

## 2019-01-04 NOTE — BHH Suicide Risk Assessment (Signed)
[place] INPATIENT:  Family/Significant Other Suicide Prevention Education  Suicide Prevention Education:  Patient Refusal for Family/Significant Other Suicide Prevention Education: The patient Troy Wiley has refused to provide written consent for family/significant other to be provided Family/Significant Other Suicide Prevention Education during admission and/or prior to discharge.  Physician notified.  SPE completed with pt, as pt refused to consent to family contact. SPI pamphlet provided to pt and pt was encouraged to share information with support network, ask questions, and talk about any concerns relating to SPE. Pt denies access to guns/firearms and verbalized understanding of information provided. Mobile Crisis information also provided to pt.   Rozann Lesches 01/04/2019, 2:35 PM

## 2019-01-04 NOTE — ED Notes (Signed)
Pt. transfered to BHU without incident after report from. Placed in room and oriented to unit. Pt. informed that for their safety all care areas are designed for safety and monitored by security cameras at all times; and visiting hours explained to patient. Patient verbalizes understanding, and verbal contract for safety obtained.   

## 2019-01-04 NOTE — ED Notes (Signed)
Patient observed lying in bed with eyes closed  Even, unlabored respirations observed   NAD pt appears to be sleeping  I will continue to monitor along with every 15 minute visual observations and ongoing security camera monitoring    

## 2019-01-04 NOTE — BHH Suicide Risk Assessment (Signed)
[facility] Admission Suicide Risk Assessment   Nursing information obtained from:  Patient Demographic factors:  Male, Unemployed Current Mental Status:  Self-harm thoughts Loss Factors:  Financial problems / change in socioeconomic status Historical Factors:  Prior suicide attempts Risk Reduction Factors:  Responsible for children under 47 years of age, Sense of responsibility to family  Total Time spent with patient: 45 minutes Principal Problem: Cocaine abuse depression homelessness diagnosis:  Active Problems:   Major depressive disorder, recurrent episode ([place])  Subjective Data:  Patient admitted voluntarily focused on left leg pain of 2 months duration stating it keeps him up complaining of suicidal thoughts without plans here able to contract complaining of cocaine abuse and homelessness, drug screen positive for cocaine and benzodiazepines Continued Clinical Symptoms:  Alcohol Use Disorder Identification Test Final Score (AUDIT): 0 The "Alcohol Use Disorders Identification Test", Guidelines for Use in Primary Care, Second Edition.  World Pharmacologist [facility]). Score between 0-7:  no or low risk or alcohol related problems. Score between 8-15:  moderate risk of alcohol related problems. Score between 16-19:  high risk of alcohol related problems. Score 20 or above:  warrants further diagnostic evaluation for alcohol dependence and treatment.   CLINICAL FACTORS:   Alcohol/Substance Abuse/Dependencies   Musculoskeletal: Strength & Muscle Tone: within normal limits Gait & Station: normal Patient leans: N/A  Psychiatric Specialty Exam: Physical Exam  ROS  Blood pressure (!) 137/95, pulse 66, temperature 98.6 F (37 C), temperature source Oral, resp. rate 18, height 5\' 8"  (1.727 m), weight 95.7 kg, SpO2 100 %.Body mass index is 32.08 kg/m.  General Appearance: Casual  Eye Contact:  Good  Speech:  Clear and Coherent  Volume:  Normal  Mood:  Dysphoric  Affect:  Appropriate   Thought Process:  Coherent and Linear  Orientation:  Full (Time, Place, and Person)  Thought Content:  Logical  Suicidal Thoughts:  Yes.  without intent/plan  Homicidal Thoughts:  No  Memory:  Recent;   Fair  Judgement:  Fair  Insight:  Fair  Psychomotor Activity:  Normal  Concentration:  Concentration: Fair  Recall:  [employer] of Knowledge:  Fair  Language:  Fair  Akathisia:  Negative  Handed:  Right  AIMS (if indicated):     Assets:  Communication Skills Desire for Improvement Leisure Time Physical Health  ADL's:  Intact  Cognition:  WNL  Sleep:         COGNITIVE FEATURES THAT CONTRIBUTE TO RISK:  None    SUICIDE RISK:   Moderate:  Frequent suicidal ideation with limited intensity, and duration, some specificity in terms of plans, no associated intent, good self-control, limited dysphoria/symptomatology, some risk factors present, and identifiable protective factors, including available and accessible social support.  PLAN OF CARE: Admit for stabilization begin antidepressant therapy address pain with nonaddictive measures  I certify that inpatient services furnished can reasonably be expected to improve the patient's condition.   Johnn Hai, MD 01/04/2019, 2:56 PM
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
## 2019-01-04 NOTE — BH Assessment (Signed)
Patient has been accepted to [facility].  Patient assigned to room 305-2 Accepting physician is Dr. Mallie Darting.  Call report to [phone].  Representative was [facility].   ER Staff is aware of it:  Lattie Haw, ER Secretary  Dr. Corky Downs, ER MD  Amy T., Patient's Nurse

## 2019-01-04 NOTE — Progress Notes (Signed)
Patient ID: Troy Wiley, male   DOB: [birth year], 48 y.o.   MRN: [id number] Patient admitted to the unit due to increased depression SI and substance abuse.  Patient states that he has been using cocaine for the last two years and would like to be able to stop.  Patient currently endorses SI with no plan. Skin assessment complete and patient found to have dry cracked skin on his feet but no further injury. Patient belongings searched and patient found to be free of all contraband. Patient admitted to the unit without incident.

## 2019-01-04 NOTE — ED Notes (Signed)
BEHAVIORAL HEALTH ROUNDING Patient sleeping: No. Patient alert and oriented: yes Behavior appropriate: Yes.  ; If no, describe:  Nutrition and fluids offered: yes Toileting and hygiene offered: Yes  Sitter present: q15 minute observations and security camera monitoring Law enforcement present: Yes  ODS  

## 2019-01-04 NOTE — Progress Notes (Signed)
Psychoeducational Group Note  Date:  01/04/2019 Time:  2045  Group Topic/Focus:  Wrap-Up Group:   The focus of this group is to help patients review their daily goal of treatment and discuss progress on daily workbooks.  Participation Level: Did Not Attend  Participation Quality:  Not Applicable  Affect:  Not Applicable  Cognitive:  Not Applicable  Insight:  Not Applicable  Engagement in Group: Not Applicable  Additional Comments:  The patient did not attend group this evening since he was in his bedroom asleep.   Archie Balboa S 01/04/2019, 8:45 PM

## 2019-01-05 ENCOUNTER — Emergency Department (HOSPITAL_COMMUNITY): Payer: Medicare Other

## 2019-01-05 ENCOUNTER — Other Ambulatory Visit: Payer: Self-pay

## 2019-01-05 ENCOUNTER — Emergency Department (HOSPITAL_COMMUNITY)
Admission: EM | Admit: 2019-01-05 | Discharge: 2019-01-05 | Disposition: A | Discharge status: Other Acute Inpatient Hospital | Payer: Medicare Other | Source: Home / Self Care | Attending: Emergency Medicine | Admitting: Emergency Medicine

## 2019-01-05 ENCOUNTER — Encounter (HOSPITAL_COMMUNITY): Payer: Self-pay

## 2019-01-05 ENCOUNTER — Inpatient Hospital Stay (HOSPITAL_COMMUNITY)
Admission: AD | Admit: 2019-01-05 | Discharge: 2019-01-07 | Disposition: A | Discharge status: Home / Self Care | Payer: Medicare Other | Source: Intra-hospital | Attending: Psychiatry | Admitting: Psychiatry

## 2019-01-05 DIAGNOSIS — R0789 Other chest pain: Secondary | ICD-10-CM

## 2019-01-05 DIAGNOSIS — F339 Major depressive disorder, recurrent, unspecified: Secondary | ICD-10-CM | POA: Diagnosis present

## 2019-01-05 LAB — CBC
HCT: 47.9 % (ref 39.0–52.0)
Hemoglobin: 15.3 g/dL (ref 13.0–17.0)
MCH: 31 pg (ref 26.0–34.0)
MCHC: 31.9 g/dL (ref 30.0–36.0)
MCV: 97.2 fL (ref 80.0–100.0)
Platelets: 330 10*3/uL (ref 150–400)
RBC: 4.93 MIL/uL (ref 4.22–5.81)
RDW: 12.9 % (ref 11.5–15.5)
WBC: 9.9 10*3/uL (ref 4.0–10.5)
nRBC: 0 % (ref 0.0–0.2)

## 2019-01-05 LAB — D-DIMER, QUANTITATIVE: D-Dimer, Quant: 0.41 ug/mL-FEU (ref 0.00–0.50)

## 2019-01-05 LAB — BASIC METABOLIC PANEL
Anion gap: 7 (ref 5–15)
BUN: 12 mg/dL (ref 6–20)
CO2: 26 mmol/L (ref 22–32)
Calcium: 8.9 mg/dL (ref 8.9–10.3)
Chloride: 105 mmol/L (ref 98–111)
Creatinine, Ser: 1.3 mg/dL — ABNORMAL HIGH (ref 0.61–1.24)
GFR calc Af Amer: >60 mL/min (ref >60)
GFR calc non Af Amer: >60 mL/min (ref >60)
Glucose, Bld: 92 mg/dL (ref 70–99)
Potassium: 4.7 mmol/L (ref 3.5–5.1)
Sodium: 138 mmol/L (ref 135–145)

## 2019-01-05 LAB — TROPONIN I (HIGH SENSITIVITY)
Troponin I (High Sensitivity): 2 ng/L (ref <18)
Troponin I (High Sensitivity): 3 ng/L (ref <18)

## 2019-01-05 MED ORDER — TOPIRAMATE 100 MG PO TABS
100.0000 mg | ORAL_TABLET | Freq: Two times a day (BID) | ORAL | Status: DC
  Administered 2019-01-05: 10:00:00 100 mg via ORAL
  Filled 2019-01-05 (×5): qty 1

## 2019-01-05 MED ORDER — ASPIRIN 81 MG PO CHEW
CHEWABLE_TABLET | ORAL | Status: AC
  Administered 2019-01-05: 18:00:00 324 mg via ORAL
  Filled 2019-01-05: qty 1

## 2019-01-05 MED ORDER — ACETAMINOPHEN 325 MG PO TABS
650.0000 mg | ORAL_TABLET | Freq: Once | ORAL | Status: AC
  Administered 2019-01-05: 650 mg via ORAL
  Filled 2019-01-05: qty 2

## 2019-01-05 MED ORDER — ALUM & MAG HYDROXIDE-SIMETH 200-200-20 MG/5ML PO SUSP
30.0000 mL | Freq: Once | ORAL | Status: AC
  Administered 2019-01-05: 30 mL via ORAL
  Filled 2019-01-05: qty 30

## 2019-01-05 MED ORDER — ASPIRIN 81 MG PO CHEW
324.0000 mg | CHEWABLE_TABLET | Freq: Once | ORAL | Status: AC
  Administered 2019-01-05: 18:00:00 324 mg via ORAL

## 2019-01-05 NOTE — ED Notes (Signed)
[facility] to transport pt back to [facility]

## 2019-01-05 NOTE — ED Provider Notes (Signed)
Care handoff received from Dr. Ayesha Rumpf at shift change please see her note for full details.  In short 48 year old male presents today for chest pain from behavioral health [facility], he is there for detox.  Reproducible right-sided chest pain.  CBC within normal limits BMP with creatinine of 1.30 appears baseline D-dimer negative Initial HS troponin: 2 CXR:  IMPRESSION:  No acute cardiopulmonary process.   Patient given GI cocktail and Tylenol.  Plan of care at shift change is to await EKG and delta troponin, if no acute findings patient is cleared for behavioral health Hospital. Physical Exam  BP (!) 152/86   Pulse (!) 57   Temp 98.1 F (36.7 C) (Oral)   Resp 16   Wt 95.7 kg   SpO2 100%   BMI 32.08 kg/m   Physical Exam Constitutional:      General: He is not in acute distress.    Appearance: Normal appearance. He is well-developed. He is not ill-appearing or diaphoretic.  HENT:     Head: Normocephalic and atraumatic.     Right Ear: External ear normal.     Left Ear: External ear normal.     Nose: Nose normal.  Eyes:     General: Vision grossly intact. Gaze aligned appropriately.     Pupils: Pupils are equal, round, and reactive to light.  Neck:     Musculoskeletal: Normal range of motion.     Trachea: Trachea and phonation normal. No tracheal deviation.  Pulmonary:     Effort: Pulmonary effort is normal. No respiratory distress.  Abdominal:     General: There is no distension.  Musculoskeletal: Normal range of motion.  Skin:    General: Skin is warm and dry.  Neurological:     Mental Status: He is alert.     GCS: GCS eye subscore is 4. GCS verbal subscore is 5. GCS motor subscore is 6.     Comments: Speech is clear and goal oriented, follows commands Major Cranial nerves without deficit, no facial droop Moves extremities without ataxia, coordination intact  Psychiatric:        Behavior: Behavior normal.     ED Course/Procedures     Procedures  MDM  Delta  HS troponin: 3  EKG:  Vent. rate 57 BPM PR interval * ms QRS duration 75 ms QT/QTc 401/391 ms P-R-T axes 64 40 -12 Sinus rhythm Low voltage, precordial leads Borderline T abnormalities, inferior leads no ischemic changes, no sig change Confirmed by Charlesetta Shanks [id number]) on 01/05/2019 10:46:46 PM ------------------ Vital signs stable, patient reassessed resting comfortably and in no acute distress.  Reports complete resolution of his symptoms following GI cocktail today he has no further complaints and is requesting to go to behavioral health Hospital.  He is also requesting food to eat.  With reassuring work-up as above do not suspect ACS, PE, dissection or other acute cardiopulmonary etiologies of symptoms today, atypical chest pain. At this time there does not appear to be any evidence of an acute emergency medical condition patient cleared for psychiatric evaluation.   Patient's case discussed with Dr. Johnney Killian who agrees with medical clearance  Note: Portions of this report may have been transcribed using voice recognition software. Every effort was made to ensure accuracy; however, inadvertent computerized transcription errors may still be present.   Gari Crown 01/05/19 2300    Charlesetta Shanks, MD 01/16/19 [phone]

## 2019-01-05 NOTE — ED Notes (Signed)
Sitter from [facility] at bedside with pt

## 2019-01-05 NOTE — Tx Team (Signed)
Interdisciplinary Treatment and Diagnostic Plan Update  01/05/2019 Time of Session: 10:14am Troy Wiley MRN: [id number]  Principal Diagnosis: <principal problem not specified>  Secondary Diagnoses: Active Problems:   Major depressive disorder, recurrent episode ([place])   Current Medications:  Current Facility-Administered Medications  Medication Dose Route Frequency Provider Last Rate Last Dose  . acetaminophen (TYLENOL) tablet 650 mg  650 mg Oral Q6H PRN Nwoko, Agnes I, NP      . alum & mag hydroxide-simeth (MAALOX/MYLANTA) 200-200-20 MG/5ML suspension 30 mL  30 mL Oral Q4H PRN Nwoko, Agnes I, NP      . carvedilol (COREG) tablet 25 mg  25 mg Oral BID WC Johnn Hai, MD   25 mg at 01/05/19 0942  . FLUoxetine (PROZAC) capsule 20 mg  20 mg Oral Daily Johnn Hai, MD   20 mg at 01/05/19 0944  . gabapentin (NEURONTIN) capsule 300 mg  300 mg Oral TID Johnn Hai, MD   300 mg at 01/05/19 0942  . magnesium hydroxide (MILK OF MAGNESIA) suspension 30 mL  30 mL Oral Daily PRN Nwoko, Agnes I, NP      . nicotine (NICODERM CQ - dosed in mg/24 hours) patch 21 mg  21 mg Transdermal Q0600 Lindell Spar I, NP   21 mg at 01/05/19 0944  . temazepam (RESTORIL) capsule 30 mg  30 mg Oral QHS Johnn Hai, MD   30 mg at 01/04/19 2154  . topiramate (TOPAMAX) tablet 100 mg  100 mg Oral BID Johnn Hai, MD   100 mg at 01/05/19 0943  . traZODone (DESYREL) tablet 50 mg  50 mg Oral QHS PRN Lindell Spar I, NP       PTA Medications: No medications prior to admission.    Patient Stressors: Substance abuse  Patient Strengths: Agricultural engineer for treatment/growth  Treatment Modalities: Medication Management, Group therapy, Case management,  1 to 1 session with clinician, Psychoeducation, Recreational therapy.   Physician Treatment Plan for Primary Diagnosis: <principal problem not specified> Long Term Goal(s): Improvement in symptoms so as ready for discharge Improvement in symptoms so  as ready for discharge   Short Term Goals: Ability to identify changes in lifestyle to reduce recurrence of condition will improve Ability to verbalize feelings will improve Ability to disclose and discuss suicidal ideas Ability to demonstrate self-control will improve Ability to identify and develop effective coping behaviors will improve  Medication Management: Evaluate patient's response, side effects, and tolerance of medication regimen.  Therapeutic Interventions: 1 to 1 sessions, Unit Group sessions and Medication administration.  Evaluation of Outcomes: Progressing  Physician Treatment Plan for Secondary Diagnosis: Active Problems:   Major depressive disorder, recurrent episode ([place])  Long Term Goal(s): Improvement in symptoms so as ready for discharge Improvement in symptoms so as ready for discharge   Short Term Goals: Ability to identify changes in lifestyle to reduce recurrence of condition will improve Ability to verbalize feelings will improve Ability to disclose and discuss suicidal ideas Ability to demonstrate self-control will improve Ability to identify and develop effective coping behaviors will improve     Medication Management: Evaluate patient's response, side effects, and tolerance of medication regimen.  Therapeutic Interventions: 1 to 1 sessions, Unit Group sessions and Medication administration.  Evaluation of Outcomes: Progressing   RN Treatment Plan for Primary Diagnosis: <principal problem not specified> Long Term Goal(s): Knowledge of disease and therapeutic regimen to maintain health will improve  Short Term Goals: Ability to participate in decision making will improve, Ability to verbalize feelings  will improve, Ability to disclose and discuss suicidal ideas, Ability to identify and develop effective coping behaviors will improve and Compliance with prescribed medications will improve  Medication Management: RN will administer medications as  ordered by provider, will assess and evaluate patient's response and provide education to patient for prescribed medication. RN will report any adverse and/or side effects to prescribing provider.  Therapeutic Interventions: 1 on 1 counseling sessions, Psychoeducation, Medication administration, Evaluate responses to treatment, Monitor vital signs and CBGs as ordered, Perform/monitor CIWA, COWS, AIMS and Fall Risk screenings as ordered, Perform wound care treatments as ordered.  Evaluation of Outcomes: Progressing   LCSW Treatment Plan for Primary Diagnosis: <principal problem not specified> Long Term Goal(s): Safe transition to appropriate next level of care at discharge, Engage patient in therapeutic group addressing interpersonal concerns.  Short Term Goals: Engage patient in aftercare planning with referrals and resources and Increase skills for wellness and recovery  Therapeutic Interventions: Assess for all discharge needs, 1 to 1 time with Social worker, Explore available resources and support systems, Assess for adequacy in community support network, Educate family and significant other(s) on suicide prevention, Complete Psychosocial Assessment, Interpersonal group therapy.  Evaluation of Outcomes: Progressing   Progress in Treatment: Attending groups: No. Participating in groups: No. Taking medication as prescribed: Yes. Toleration medication: Yes. Family/Significant other contact made: No, will contact:  pt declined Patient understands diagnosis: Yes. Discussing patient identified problems/goals with staff: Yes. Medical problems stabilized or resolved: Yes. Denies suicidal/homicidal ideation: Yes. Issues/concerns per patient self-inventory: No. Other:   New problem(s) identified: No, Describe:  None  New Short Term/Long Term Goal(s): Medication stabilization, elimination of SI thoughts, and development of a comprehensive mental wellness plan.   Patient Goals:     Discharge Plan or Barriers: CSW will continue to follow up for appropriate referrals and possible discharge planning  Reason for Continuation of Hospitalization: Aggression Medical Issues Medication stabilization  Estimated Length of Stay: 2-3 days  Attendees: Patient: 01/05/2019   Physician: Dr. Malvin JohnsBrian Farah, MD 01/05/2019   Nursing: Marchelle FolksAmanda , RN 01/05/2019   RN Care Manager: 01/05/2019   Social Worker: Stephannie PetersJasmine Jarika Robben, LCSW 01/05/2019   Recreational Therapist:  01/05/2019  Other:  01/05/2019   Other:  01/05/2019  Other: 01/05/2019      Scribe for Treatment Team: Delphia GratesJasmine M Tahara Ruffini, LCSW 01/05/2019 3:34 PM

## 2019-01-05 NOTE — Care Management (Signed)
CMA attempted to contact Candy with Admissions at [facility].  CMA will continue to follow up.    CMA will notify CSW.     Marlys Stegmaier Care Management Assistant  Email:[email or web address] Office: [phone]

## 2019-01-05 NOTE — ED Triage Notes (Addendum)
Pt arrives [facility] from [facility] for eval of chest pain onset 1730 this evening w/ sharp R sided chest pain. Reports he was laying in bed when started, worse w/ movement. Rec'd 324 ASA at [facility] and 1 SL NTG w/ no relief from EMS

## 2019-01-05 NOTE — Plan of Care (Signed)
  Problem: Education: Goal: Knowledge of [facility] Education information/materials will improve Outcome: Progressing   Problem: Health Behavior/Discharge Planning: Goal: Compliance with treatment plan for underlying cause of condition will improve Outcome: Progressing   Problem: Physical Regulation: Goal: Ability to maintain clinical measurements within normal limits will improve Outcome: Progressing   Problem: Safety: Goal: Periods of time without injury will increase Outcome: Progressing   

## 2019-01-05 NOTE — ED Notes (Signed)
Pt d/c'd from ED and taken back to [facility] by Betsy Pries
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
## 2019-01-05 NOTE — Progress Notes (Signed)
Patient c/o acute CP, not clammy, diaphoretic, dizzy, unsteady, short of breath or any other symptoms. Patient has hx of cocaine use, which is concerning. C/O HA with elevated BP 158/103 and HR 69. Administered Coreg. Obtained order for emergent ED eval and treat. Called 911. Called ED charge nurse. Administered 324mg  chewable ASA as instructed by emergency personnel.

## 2019-01-05 NOTE — Progress Notes (Signed)
Patient ID: Troy Wiley, male   DOB: [birth year], 48 y.o.   MRN: [id number]  Nursing Progress Note [id number]  Patient presents pleasant on approach with no complaints or concerns. Patient compliant with scheduled medications. Patient observed resting in his room all morning. Patient currently denies SI/HI/AVH. No withdrawal or distress observed.  Patient safety maintained with q15 min safety checks. Medications provided as ordered.  Patient remains safe on the unit at this time. Will continue to support and monitor with plan of care.
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
## 2019-01-05 NOTE — Care Management (Signed)
CMA sent referral to [facility].  CMA will follow up with Admissions.     CMA will notify CSW.      Elfego Giammarino Care Management Assistant  Email:[email or web address] Office: [phone]
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
## 2019-01-06 DIAGNOSIS — F339 Major depressive disorder, recurrent, unspecified: Secondary | ICD-10-CM | POA: Diagnosis present

## 2019-01-06 DIAGNOSIS — F332 Major depressive disorder, recurrent severe without psychotic features: Secondary | ICD-10-CM

## 2019-01-06 MED ORDER — MAGNESIUM HYDROXIDE 400 MG/5ML PO SUSP: 30.0000 mL | Freq: Every day | ORAL | Status: DC | PRN

## 2019-01-06 MED ORDER — ACETAMINOPHEN 325 MG PO TABS
650.0000 mg | ORAL_TABLET | Freq: Four times a day (QID) | ORAL | Status: DC | PRN
  Administered 2019-01-06 (×2): 650 mg via ORAL
  Filled 2019-01-06 (×2): qty 2

## 2019-01-06 MED ORDER — TOPIRAMATE 100 MG PO TABS
100.0000 mg | ORAL_TABLET | Freq: Two times a day (BID) | ORAL | Status: DC
  Administered 2019-01-06: 100 mg via ORAL
  Filled 2019-01-06 (×3): qty 1

## 2019-01-06 MED ORDER — TEMAZEPAM 15 MG PO CAPS
30.0000 mg | ORAL_CAPSULE | Freq: Every day | ORAL | Status: DC
  Administered 2019-01-06 (×2): 30 mg via ORAL
  Filled 2019-01-06 (×2): qty 2

## 2019-01-06 MED ORDER — CARVEDILOL 25 MG PO TABS
25.0000 mg | ORAL_TABLET | Freq: Two times a day (BID) | ORAL | Status: DC
  Administered 2019-01-06 – 2019-01-07 (×2): 25 mg via ORAL
  Filled 2019-01-06 (×5): qty 1

## 2019-01-06 MED ORDER — GABAPENTIN 300 MG PO CAPS
300.0000 mg | ORAL_CAPSULE | Freq: Three times a day (TID) | ORAL | Status: DC
  Administered 2019-01-06 – 2019-01-07 (×5): 300 mg via ORAL
  Filled 2019-01-06 (×7): qty 1

## 2019-01-06 MED ORDER — NICOTINE 21 MG/24HR TD PT24
21.0000 mg | MEDICATED_PATCH | Freq: Every day | TRANSDERMAL | Status: DC
  Administered 2019-01-06 – 2019-01-07 (×2): 21 mg via TRANSDERMAL
  Filled 2019-01-06 (×3): qty 1

## 2019-01-06 MED ORDER — FLUOXETINE HCL 20 MG PO CAPS
20.0000 mg | ORAL_CAPSULE | Freq: Every day | ORAL | Status: DC
  Administered 2019-01-06 – 2019-01-07 (×2): 20 mg via ORAL
  Filled 2019-01-06 (×3): qty 1

## 2019-01-06 MED ORDER — TOPIRAMATE 100 MG PO TABS
100.0000 mg | ORAL_TABLET | Freq: Three times a day (TID) | ORAL | Status: DC
  Administered 2019-01-06 – 2019-01-07 (×4): 100 mg via ORAL
  Filled 2019-01-06 (×8): qty 1

## 2019-01-06 MED ORDER — HYDROXYZINE HCL 50 MG PO TABS
50.0000 mg | ORAL_TABLET | Freq: Once | ORAL | Status: AC
  Administered 2019-01-06: 50 mg via ORAL
  Filled 2019-01-06 (×2): qty 1

## 2019-01-06 MED ORDER — ALUM & MAG HYDROXIDE-SIMETH 200-200-20 MG/5ML PO SUSP: 30.0000 mL | ORAL | Status: DC | PRN

## 2019-01-06 NOTE — Plan of Care (Signed)
D: Denies SI, HI, AVH, and verbally contracts for safety.    A: Medications administered per MD order. Support provided. Patient educated on safety on the unit and medications. Routine safety checks every 15 minutes. Patient stated understanding to tell nurse about any new physical symptoms. Patient understands to tell staff of any needs.     R: No adverse drug reactions noted. Patient verbally contracts for safety. Patient remains safe at this time and will continue to monitor.   Problem: Education: Goal: Knowledge of [place] [facility] Education information/materials will improve Outcome: Progressing   [place] NOVEL CORONAVIRUS (COVID-19) DAILY CHECK-OFF SYMPTOMS - answer yes or no to each - every day NO YES  Have you had a fever in the past 24 hours?  Fever (Temp > 37.80C / 100F) X   Have you had any of these symptoms in the past 24 hours? New Cough  Sore Throat   Shortness of Breath  Difficulty Breathing  Unexplained Body Aches   X   Have you had any one of these symptoms in the past 24 hours not related to allergies?   Runny Nose  Nasal Congestion  Sneezing   X   If you have had runny nose, nasal congestion, sneezing in the past 24 hours, has it worsened?  X   EXPOSURES - check yes or no X   Have you traveled outside the state in the past 14 days?  X   Have you been in contact with someone with a confirmed diagnosis of COVID-19 or PUI in the past 14 days without wearing appropriate PPE?  X   Have you been living in the same home as a person with confirmed diagnosis of COVID-19 or a PUI (household contact)?    X   Have you been diagnosed with COVID-19?    X              What to do next: Answered NO to all: Answered YES to anything:   Proceed with unit schedule Follow the [facility] Inpatient Flowsheet.    

## 2019-01-06 NOTE — Care Management (Signed)
CMA spoke to [facility] with Admissions at [facility]. Per Candy, patient has been declined for treatment due to chronic pain.   CMA has attempted to contact [facility].  CMA will continue to follow up.    CMA will notify CSW, Stephanie Acre.       Londan Coplen Care Management Assistant  Email:[email or web address] Office: [phone]

## 2019-01-06 NOTE — Progress Notes (Signed)
D: Pt denies SI/HI/AVH. Pt is pleasant and cooperative. Pt stated he felt a little better this evening.  A: Pt was offered support and encouragement. Pt was given scheduled medications. Pt was encourage to attend groups. Q 15 minute checks were done for safety.  R: safety maintained on unit.

## 2019-01-06 NOTE — Progress Notes (Signed)
CSW attempted to meet with patient twice to update him on referrals and discuss discharge planning.   Patient appeared to be sleeping during both attempts, he did not respond to CSW knocking at door or calling name multiple times.  Patient was declined by [facility] for residential substance use treatment. CSW will provide patient with shelter resources prior to discharge.  Stephanie Acre, LCSW-A Clinical Social Worker

## 2019-01-06 NOTE — Progress Notes (Signed)
Pt was sent to the ED and was discharged out of the system , so when pt came back to [facility] pt information had to be re-entered due to it looking like a totally different encounter.
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
## 2019-01-06 NOTE — Progress Notes (Signed)
Patient was calm and cooperative- denies withdrawal symptoms. Patient has been seen socializing with peers in the day room. Denies all SI HI AVH. Denies medication side effects.  Safety is maintained with 15 checks as well as environmental checks. Will continue to monitor and assess.  

## 2019-01-07 MED ORDER — TOPIRAMATE 100 MG PO TABS: 100.0000 mg | ORAL_TABLET | Freq: Three times a day (TID) | ORAL | 1 refills | Status: DC

## 2019-01-07 MED ORDER — FLUOXETINE HCL 20 MG PO CAPS: 20.0000 mg | ORAL_CAPSULE | Freq: Every day | ORAL | 1 refills | Status: DC

## 2019-01-07 MED ORDER — CARVEDILOL 25 MG PO TABS: 25.0000 mg | ORAL_TABLET | Freq: Two times a day (BID) | ORAL | 2 refills | Status: DC

## 2019-01-07 MED ORDER — GABAPENTIN 300 MG PO CAPS: 300.0000 mg | ORAL_CAPSULE | Freq: Three times a day (TID) | ORAL | 1 refills | Status: DC

## 2019-01-07 NOTE — BHH Suicide Risk Assessment (Signed)
[facility] Discharge Suicide Risk Assessment   Principal Problem: Substance abuse/homelessness/complaints of depressive symptoms/pain complaints Discharge Diagnoses: Active Problems:   MDD (major depressive disorder), recurrent episode ([place])   Total Time spent with patient: 45 minutes  Musculoskeletal: Strength & Muscle Tone: within normal limits Gait & Station: normal Patient leans: N/A  Psychiatric Specialty Exam: ROS  Blood pressure 125/72, pulse 67, SpO2 100 %.There is no height or weight on file to calculate BMI.  General Appearance: Casual  Eye Contact::  Good  Speech:  Clear and Coherent409  Volume:  Normal  Mood:  Dysphoric  Affect:  Restricted  Thought Process:  Coherent and Descriptions of Associations: Intact  Orientation:  Full (Time, Place, and Person)  Thought Content:  Rumination  Suicidal Thoughts:  No  Homicidal Thoughts:  No  Memory:  Immediate;   Fair Recent;   Good Remote;   Fair  Judgement:  Fair  Insight:  Fair  Psychomotor Activity:  Normal  Concentration:  Fair  Recall:  [employer] of Knowledge:Fair  Language: Fair  Akathisia:  Negative  Handed:  Right  AIMS (if indicated):     Assets:  Communication Skills Desire for Improvement Resilience  Sleep:  Number of Hours: 5.75  Cognition: WNL  ADL's:  Intact   Mental Status Per Nursing Assessment::   On Admission:     Demographic Factors:  Male  Loss Factors: Decline in physical health  Historical Factors: NA  Risk Reduction Factors:   Religious beliefs about death  Continued Clinical Symptoms:  Alcohol/Substance Abuse/Dependencies  Cognitive Features That Contribute To Risk:  None    Suicide Risk:  Mild:  Suicidal ideation of limited frequency, intensity, duration, and specificity.  There are no identifiable plans, no associated intent, mild dysphoria and related symptoms, good self-control (both objective and subjective assessment), few other risk factors, and identifiable protective  factors, including available and accessible social support.    Plan Of Care/Follow-up recommendations:  Activity:  full  Troy Barna, MD 01/07/2019, 7:53 AM

## 2019-01-07 NOTE — Progress Notes (Signed)
Discharge  Note:  Patient discharged.  Patient denied SI and HI.  Denied A/V hallucinations.  Suicide prevention information given and discussed with patient who stated he understood and had no questions.  Patient stated he received all his belongings, clothing, toiletries, misc items, etc.  Patient stated he appreciated all assistance received from [facility] staff.  All required discharge information given to patient at discharge.

## 2019-01-07 NOTE — Care Management (Signed)
CMA spoke with Intake Coordinator, Emm[place] at [facility]. Per Lise Auer has limited beds available at this time due to COVID. However, Myrlene Broker will follow up with patient after discharge when a bed becomes available.   CMA has notified CSW, Stephanie Acre.      Jalene Demo Care Management Assistant  Email:[email or web address] Office: [phone]
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
## 2019-01-07 NOTE — Progress Notes (Signed)
  [facility] Adult Case Management Discharge Plan :  Will you be returning to the same living situation after discharge:  Yes,  home At discharge, do you have transportation home?: Yes,  Lyft/[employer] Do you have the ability to pay for your medications: Yes,  [employer] Medicare.  Release of information consent forms completed and in the chart.  Patient to Follow up at: Follow-up Information    [facility] Follow up.   Contact information: [place] [phone] [place]. Call.   Specialty: Addiction Medicine Why: A referral has been for you. At this time beds are limited due to [place]. Myrlene Broker will contact you after discharge when a possible bed becomes available. If you do not hear from her within 3 days of discharge please follow up with her.  Contact information: [place] [address] [phone]           Next level of care provider has access to [place] and Suicide Prevention discussed: Yes,  with patient.   Has patient been referred to the Quitline?: Patient refused referral  Patient has been referred for addiction treatment: Yes  Joellen Jersey, [place] 01/07/2019, 1:51 PM

## 2019-01-07 NOTE — Progress Notes (Signed)
D:  Patient's self inventory sheet, patient has poor sleep, sleep medication not helpful.  Good appetite, normal energy level, good concentration.  Rated depression, hopeless and anxiety 5.  Denied withdrawals.  Denied SI.  Denied physical problems, pain, headaches.  Leg pain, headache, pain medicine helpful.   Goal is focus on herself and attend rehab.  Does have discharge plans. A:  Medications administered per MD orders.  Emotional support and encouragement given patient. R:  Denied SI and HI, contracts for safety.  Denied A/V hallucinations.  Safety maintained with 15 minute cvecks.

## 2020-07-22 ENCOUNTER — Other Ambulatory Visit: Payer: Self-pay

## 2020-07-22 DIAGNOSIS — F17210 Nicotine dependence, cigarettes, uncomplicated: Secondary | ICD-10-CM | POA: Insufficient documentation

## 2020-07-22 DIAGNOSIS — G44309 Post-traumatic headache, unspecified, not intractable: Secondary | ICD-10-CM | POA: Diagnosis not present

## 2020-07-22 DIAGNOSIS — Z20822 Contact with and (suspected) exposure to covid-19: Secondary | ICD-10-CM | POA: Insufficient documentation

## 2020-07-22 DIAGNOSIS — R45851 Suicidal ideations: Secondary | ICD-10-CM | POA: Diagnosis not present

## 2020-07-22 DIAGNOSIS — Z79899 Other long term (current) drug therapy: Secondary | ICD-10-CM | POA: Diagnosis not present

## 2020-07-22 DIAGNOSIS — I10 Essential (primary) hypertension: Secondary | ICD-10-CM | POA: Diagnosis not present

## 2020-07-22 DIAGNOSIS — F1420 Cocaine dependence, uncomplicated: Secondary | ICD-10-CM | POA: Diagnosis not present

## 2020-07-22 DIAGNOSIS — Z046 Encounter for general psychiatric examination, requested by authority: Secondary | ICD-10-CM | POA: Diagnosis present

## 2020-07-23 ENCOUNTER — Emergency Department
Admission: EM | Admit: 2020-07-23 | Discharge: 2020-07-23 | Disposition: A | Discharge status: Other Acute Inpatient Hospital | Payer: Medicare HMO | Attending: Emergency Medicine | Admitting: Emergency Medicine

## 2020-07-23 ENCOUNTER — Other Ambulatory Visit: Payer: Self-pay

## 2020-07-23 DIAGNOSIS — F1420 Cocaine dependence, uncomplicated: Secondary | ICD-10-CM | POA: Diagnosis not present

## 2020-07-23 DIAGNOSIS — R45851 Suicidal ideations: Secondary | ICD-10-CM

## 2020-07-23 DIAGNOSIS — F1410 Cocaine abuse, uncomplicated: Secondary | ICD-10-CM

## 2020-07-23 LAB — COMPREHENSIVE METABOLIC PANEL
ALT: 11 U/L (ref 0–44)
AST: 12 U/L — ABNORMAL LOW (ref 15–41)
Albumin: 3.8 g/dL (ref 3.5–5.0)
Alkaline Phosphatase: 46 U/L (ref 38–126)
Anion gap: 8 (ref 5–15)
BUN: 13 mg/dL (ref 6–20)
CO2: 24 mmol/L (ref 22–32)
Calcium: 9 mg/dL (ref 8.9–10.3)
Chloride: 106 mmol/L (ref 98–111)
Creatinine, Ser: 1.09 mg/dL (ref 0.61–1.24)
GFR, Estimated: >60 mL/min (ref >60)
Glucose, Bld: 103 mg/dL — ABNORMAL HIGH (ref 70–99)
Potassium: 3.9 mmol/L (ref 3.5–5.1)
Sodium: 138 mmol/L (ref 135–145)
Total Bilirubin: 0.4 mg/dL (ref 0.3–1.2)
Total Protein: 6.6 g/dL (ref 6.5–8.1)

## 2020-07-23 LAB — URINE DRUG SCREEN, QUALITATIVE (ARMC ONLY)
Amphetamines, Ur Screen: NOT DETECTED
Barbiturates, Ur Screen: NOT DETECTED
Benzodiazepine, Ur Scrn: NOT DETECTED
Cannabinoid 50 Ng, Ur ~~LOC~~: NOT DETECTED
Cocaine Metabolite,Ur ~~LOC~~: POSITIVE — AB
MDMA (Ecstasy)Ur Screen: NOT DETECTED
Methadone Scn, Ur: NOT DETECTED
Opiate, Ur Screen: NOT DETECTED
Phencyclidine (PCP) Ur S: NOT DETECTED
Tricyclic, Ur Screen: NOT DETECTED

## 2020-07-23 LAB — CBC
HCT: 43.5 % (ref 39.0–52.0)
Hemoglobin: 14.3 g/dL (ref 13.0–17.0)
MCH: 30.5 pg (ref 26.0–34.0)
MCHC: 32.9 g/dL (ref 30.0–36.0)
MCV: 92.8 fL (ref 80.0–100.0)
Platelets: 253 10*3/uL (ref 150–400)
RBC: 4.69 MIL/uL (ref 4.22–5.81)
RDW: 12.7 % (ref 11.5–15.5)
WBC: 9.5 10*3/uL (ref 4.0–10.5)
nRBC: 0 % (ref 0.0–0.2)

## 2020-07-23 LAB — RESP PANEL BY RT-PCR (FLU A&B, COVID) ARPGX2
Influenza A by PCR: NEGATIVE
Influenza B by PCR: NEGATIVE
SARS Coronavirus 2 by RT PCR: NEGATIVE

## 2020-07-23 LAB — ETHANOL: Alcohol, Ethyl (B): <10 mg/dL (ref <10)

## 2020-07-23 LAB — ACETAMINOPHEN LEVEL: Acetaminophen (Tylenol), Serum: <10 ug/mL — ABNORMAL LOW (ref 10–30)

## 2020-07-23 LAB — SALICYLATE LEVEL: Salicylate Lvl: <7 mg/dL — ABNORMAL LOW (ref 7.0–30.0)

## 2020-07-23 MED ORDER — FLUOXETINE HCL 20 MG PO CAPS
20.0000 mg | ORAL_CAPSULE | Freq: Every day | ORAL | Status: DC
  Administered 2020-07-23: 20 mg via ORAL
  Filled 2020-07-23: qty 1

## 2020-07-23 MED ORDER — TOPIRAMATE 25 MG PO TABS
100.0000 mg | ORAL_TABLET | Freq: Three times a day (TID) | ORAL | Status: DC
  Administered 2020-07-23 (×2): 100 mg via ORAL
  Filled 2020-07-23 (×2): qty 4

## 2020-07-23 MED ORDER — CARVEDILOL 25 MG PO TABS
25.0000 mg | ORAL_TABLET | Freq: Two times a day (BID) | ORAL | Status: DC
  Administered 2020-07-23 (×2): 25 mg via ORAL
  Filled 2020-07-23 (×2): qty 1

## 2020-07-23 MED ORDER — GABAPENTIN 300 MG PO CAPS
300.0000 mg | ORAL_CAPSULE | Freq: Three times a day (TID) | ORAL | Status: DC
  Administered 2020-07-23 (×2): 300 mg via ORAL
  Filled 2020-07-23 (×2): qty 1

## 2020-07-23 NOTE — BH Assessment (Addendum)
PATIENT IS SCHEDULED FOR ADMISSION TODAY BEFORE 8PM WITH ANY MEDICATIONS NEEDED   Patient has been accepted to [facility]   Patient assigned to: Crisis Unit Accepting physician is Dr. Blanchie Serve Call report to [phone] Representative was [facility]    ER Staff is aware of it:  Nitchia, ER Secretary  D. Katrinka Blazing, ER MD  Lattie Corns Patient's Nurse  Patient requested for no family/support to be contacted at this time   Address: [address] [place], [place] [address]

## 2020-07-23 NOTE — BH Assessment (Signed)
Comprehensive Clinical Assessment (CCA) Note  07/23/2020 Troy Wiley [id number]  Chief Complaint: Patient is a 50 year old male presenting to [facility] ED voluntarily requesting detox treatment. Per triage note Pt states coming in and wanting to detox from cocaine. Pt then added that he is suicidal and if he were going to do anything, it would be with pills.  Pt also states he feel like his left sided of the posterior surface of his head is swollen and causing pain, pt also continued by saying he passed out multiple times "the other day." Pt states "I need to be committed" Pt states taking tylenol and motrin 2 nights ago. During assessment patient appears alert and oriented x4, calm and cooperative. Patient is now currently denying SI from earlier. Patient reports he would like help locating detox treatment for his use of Cocaine. Patient reports "I just want help with drugs." Patient reports that he has has using cocaine via smoking daily "about $80 worth", he reports that he has been using cocaine daily since the age of 47 and denies having any clean time. Patient denies any current withdrawal symptoms. Patient denies SI/HI/AH/VH and does not appear to be responding to any internal or external stimuli Chief Complaint  Patient presents with  . Psychiatric Evaluation    Detox and SI   Visit Diagnosis: Cocaine Use Disorder, Severe   CCA Screening, Triage and Referral (STR)  Patient Reported Information How did you hear about [place]? Self  Referral name: No data recorded Referral phone number: No data recorded  Whom do you see for routine medical problems? Other (Comment)  Practice/Facility Name: No data recorded Practice/Facility Phone Number: No data recorded Name of Contact: No data recorded Contact Number: No data recorded Contact Fax Number: No data recorded Prescriber Name: No data recorded Prescriber Address (if known): No data recorded  What Is the Reason for Your Visit/Call Today?  No data recorded How Long Has This Been Causing You Problems? > than 6 months  What Do You Feel Would Help You the Most Today? Assessment Only   Have You Recently Been in Any Inpatient Treatment (Hospital/Detox/Crisis Center/28-Day Program)? No  Name/Location of Program/Hospital:No data recorded How Long Were You There? No data recorded When Were You Discharged? No data recorded  Have You Ever Received Services From [facility] Before? Yes  Who Do You See at [facility]? Inpatient treatment   Have You Recently Had Any Thoughts About Hurting Yourself? Yes  Are You Planning to Commit Suicide/Harm Yourself At This time? No   Have you Recently Had Thoughts About Hurting Someone Troy Wiley? No  Explanation: No data recorded  Have You Used Any Alcohol or Drugs in the Past 24 Hours? Yes  How Long Ago Did You Use Drugs or Alcohol? 0524  What Did You Use and How Much? Cocaine   Do You Currently Have a Therapist/Psychiatrist? No  Name of Therapist/Psychiatrist: No data recorded  Have You Been Recently Discharged From Any Office Practice or Programs? No  Explanation of Discharge From Practice/Program: No data recorded    CCA Screening Triage Referral Assessment Type of Contact: Face-to-Face  Is this Initial or Reassessment? No data recorded Date Telepsych consult ordered in [facility]:  No data recorded Time Telepsych consult ordered in [facility]:  No data recorded  Patient Reported Information Reviewed? Yes  Patient Left Without Being Seen? No data recorded Reason for Not Completing Assessment: No data recorded  Collateral Involvement: No data recorded  Does Patient Have a Court  Appointed Legal Guardian? No data recorded Name and Contact of Legal Guardian: No data recorded If Minor and Not Living with Parent(s), Who has Custody? No data recorded Is CPS involved or ever been involved? Never  Is APS involved or ever been involved? Never   Patient Determined To Be At Risk for Harm To  Self or Others Based on Review of Patient Reported Information or Presenting Complaint? No  Method: No data recorded Availability of Means: No data recorded Intent: No data recorded Notification Required: No data recorded Additional Information for Danger to Others Potential: No data recorded Additional Comments for Danger to Others Potential: No data recorded Are There Guns or Other Weapons in Your Home? No data recorded Types of Guns/Weapons: No data recorded Are These Weapons Safely Secured?                            No data recorded Who Could Verify You Are Able To Have These Secured: No data recorded Do You Have any Outstanding Charges, Pending Court Dates, Parole/Probation? No data recorded Contacted To Inform of Risk of Harm To Self or Others: No data recorded  Location of Assessment: [facility] ED   Does Patient Present under Involuntary Commitment? No  IVC Papers Initial File Date: No data recorded  [place] of Residence: [place]   Patient Currently Receiving the Following Services: No data recorded  Determination of Need: Emergent (2 hours)   Options For Referral: No data recorded    CCA Biopsychosocial Intake/Chief Complaint:  Patient is presenting to [facility] ED requesting detox treatment  Current Symptoms/Problems: Patient is presenting to [facility] ED requesting detox treatment   Patient Reported Schizophrenia/Schizoaffective Diagnosis in Past: No   Strengths: Patient is able to communicate his needs  Preferences: Unknown  Abilities: Patient is able to communicate his needs   Type of Services Patient Feels are Needed: Detox   Initial Clinical Notes/Concerns: None   Mental Health Symptoms Depression:  Change in energy/activity; Sleep (too much or little)   Duration of Depressive symptoms: Greater than two weeks   Mania:  None   Anxiety:   None   Psychosis:  None   Duration of Psychotic symptoms: No data recorded  Trauma:  None   Obsessions:  None    Compulsions:  None   Inattention:  None   Hyperactivity/Impulsivity:  N/A   Oppositional/Defiant Behaviors:  None   Emotional Irregularity:  None   Other Mood/Personality Symptoms:  No data recorded   Mental Status Exam Appearance and self-care  Stature:  Average   Weight:  Average weight   Clothing:  Casual   Grooming:  Normal   Cosmetic use:  None   Posture/gait:  Normal   Motor activity:  Not Remarkable   Sensorium  Attention:  Normal   Concentration:  Normal   Orientation:  X5   Recall/memory:  Normal   Affect and Mood  Affect:  Appropriate   Mood:  Depressed   Relating  Eye contact:  Normal   Facial expression:  Responsive   Attitude toward examiner:  Cooperative   Thought and Language  Speech flow: Clear and Coherent   Thought content:  Appropriate to Mood and Circumstances   Preoccupation:  None   Hallucinations:  None   Organization:  No data recorded  [employer] of Knowledge:  Fair   Intelligence:  Average   Abstraction:  Normal   Judgement:  Fair   Dance movement psychotherapist:  Adequate   Insight:  Fair   Decision Making:  Normal   Social Functioning  Social Maturity:  Responsible   Social Judgement:  Normal   Stress  Stressors:  Other (Comment)   Coping Ability:  Normal   Skill Deficits:  None   Supports:  Support needed     Religion: Religion/Spirituality Are You A Religious Person?: No  Leisure/Recreation: Leisure / Recreation Do You Have Hobbies?: No  Exercise/Diet: Exercise/Diet Do You Exercise?: No Have You Gained or Lost A Significant Amount of Weight in the Past Six Months?: No Do You Follow a Special Diet?: No Do You Have Any Trouble Sleeping?: No   CCA Employment/Education Employment/Work Situation: Employment / Work Situation Employment situation: On disability Why is patient on disability: Unknown How long has patient been on disability: Unknown Has patient ever been in the  [employer]?: No  Education: Education Is Patient Currently Attending School?: No Did Garment/textile technologist From [employer]?:  (Unknown) Did You Product manager?:  (Unknown) Did You Attend Graduate School?: No Did You Have An Individualized Education Program (IIEP): No Did You Have Any Difficulty At School?: No Patient's Education Has Been Impacted by Current Illness: No   CCA Family/Childhood History Family and Relationship History: Family history Marital status: Single Are you sexually active?:  (Unknown) What is your sexual orientation?: Unknown Has your sexual activity been affected by drugs, alcohol, medication, or emotional stress?: Unknown Does patient have children?:  (Unknown)  Childhood History:  Childhood History By whom was/is the patient raised?: Other (Comment) Additional childhood history information: None reported Description of patient's relationship with caregiver when they were a child: None reported Patient's description of current relationship with people who raised him/her: None reported How were you disciplined when you got in trouble as a child/adolescent?: None reported Does patient have siblings?: No Did patient suffer any verbal/emotional/physical/sexual abuse as a child?: No Did patient suffer from severe childhood neglect?: No Has patient ever been sexually abused/assaulted/raped as an adolescent or adult?: No Was the patient ever a victim of a crime or a disaster?: No Witnessed domestic violence?: No Has patient been affected by domestic violence as an adult?: No  Child/Adolescent Assessment:     CCA Substance Use Alcohol/Drug Use: Alcohol / Drug Use Pain Medications: See MAR Prescriptions: See MAR Over the Counter: See MAR History of alcohol / drug use?: Yes Substance #1 Name of Substance 1: Cocaine 1 - Age of First Use: 31 1 - Amount (size/oz): $80 worth 1 - Frequency: daily 1 - Duration: 18 years 1 - Last Use / Amount: 07/22/20 1 - Method of  Aquiring: Unknown 1- Route of Use: Smoking                       ASAM's:  Six Dimensions of Multidimensional Assessment  Dimension 1:  Acute Intoxication and/or Withdrawal Potential:      Dimension 2:  Biomedical Conditions and Complications:      Dimension 3:  Emotional, Behavioral, or Cognitive Conditions and Complications:     Dimension 4:  Readiness to Change:     Dimension 5:  Relapse, Continued use, or Continued Problem Potential:     Dimension 6:  Recovery/Living Environment:     ASAM Severity Score:    ASAM Recommended Level of Treatment:     Substance use Disorder (SUD) Substance Use Disorder (SUD)  Checklist Symptoms of Substance Use: Continued use despite having a persistent/recurrent physical/psychological problem caused/exacerbated by use,Continued use despite persistent  or recurrent social, interpersonal problems, caused or exacerbated by use,Evidence of tolerance,Evidence of withdrawal (Comment),Large amounts of time spent to obtain, use or recover from the substance(s),Persistent desire or unsuccessful efforts to cut down or control use,Presence of craving or strong urge to use,Social, occupational, recreational activities given up or reduced due to use,Recurrent use that results in a failure to fulfill major role obligations (work, school, home),Repeated use in physically hazardous situations,Substance(s) often taken in larger amounts or over longer times than was intended  Recommendations for Services/Supports/Treatments:  Detox treatment  DSM5 Diagnoses: Patient Active Problem List   Diagnosis Date Noted  . MDD (major depressive disorder), recurrent episode (HCC) 01/06/2019  . Major depressive disorder, recurrent episode (HCC) 01/04/2019  . Cocaine abuse (HCC) 01/28/2018  . Chronic pain 01/28/2018    Patient Centered Plan: Patient is on the following Treatment Plan(s):  Substance Abuse   Referrals to Alternative Service(s): Referred to Alternative  Service(s):   Place:   Date:   Time:    Referred to Alternative Service(s):   Place:   Date:   Time:    Referred to Alternative Service(s):   Place:   Date:   Time:    Referred to Alternative Service(s):   Place:   Date:   Time:     Rhonda Vangieson A Bessie Boyte, LCAS-A

## 2020-07-23 NOTE — ED Notes (Signed)
CALL RECEIVED FROM [facility] SPOKE WITH INTAKE NURSE WILL BE CALLING [facility] FOR FURTHER INFORMATION.

## 2020-07-23 NOTE — ED Notes (Signed)
Beth, ed tech took pt's belongings to [employer] area.

## 2020-07-23 NOTE — ED Notes (Signed)
EKG completed and given to Dr Elesa Massed. Patient is now medically cleared per EDP.

## 2020-07-23 NOTE — ED Notes (Signed)
[facility] called to give report. Report given to [facility], aware patient will be arriving via safe transport.

## 2020-07-23 NOTE — ED Notes (Signed)
Charge RN made aware of pt

## 2020-07-23 NOTE — ED Notes (Signed)
Dietary called for missing breakfast tray. Patient meds administered as scheduled. Patient awaiting transfer to [facility]. Currently calm and cooperative.

## 2020-07-23 NOTE — ED Notes (Signed)
Patient states that he is here with intents of cocaine detox. Patient denies SI/HI originally. Patient later informed this RN that "if I'm going to hurt anyone it will be myself". Patient does not have specific plan for suicide. Patient given food and drink. Laying in Medical illustrator in hallway.

## 2020-07-23 NOTE — ED Notes (Addendum)
Patient has been accepted to [facility] [phone]# [phone])  tomorrow 07/24/2020

## 2020-07-23 NOTE — BH Assessment (Addendum)
Referral information for Detox treatment faxed to;   . [facility] [phone])  . [facility] [phone]) Will reports male beds are available but did not receive referral, referral re-sent at 6:50am. Will reports to contact back later  . [facility] [phone])

## 2020-07-23 NOTE — ED Triage Notes (Signed)
Pt states coming in and wanting to detox from cocaine. Pt then added that he is suicidal and if he were going to do anything, it would be with pills.  Pt also states he feel like his left sided of the posterior surface of his head is swollen and causing pain, pt also continued by saying he passed out multiple times "the other day." Pt states "I need to be committed" Pt states taking tylenol and motrin 2 nights ago.
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
## 2020-07-23 NOTE — ED Notes (Addendum)
Pt dressed out with assist of stephanie, Engineer, materials with ODS. The following items placed into one of one labeled bag: black and red sweatshirt, pants, sneakers. White t shirt, white socks, wallet, black nokia phone, lighter, plaid boxer shorts and key. Bag given to [employer], rn.

## 2020-08-29 IMAGING — DX CHEST - 2 VIEW
2 series · 2 of 2 positions shown · non-contrast
Comparison: None.

CLINICAL DATA: Right side chest pain today. Pt denies SOB. Pt also
c/o posterior left neck pain and shooting pains into the left leg.
Pt has hx of cervical stenosis and surgical repair. Hx of HTN.
Smoker.CP

EXAM:
CHEST - 2 VIEW

[chest pa]
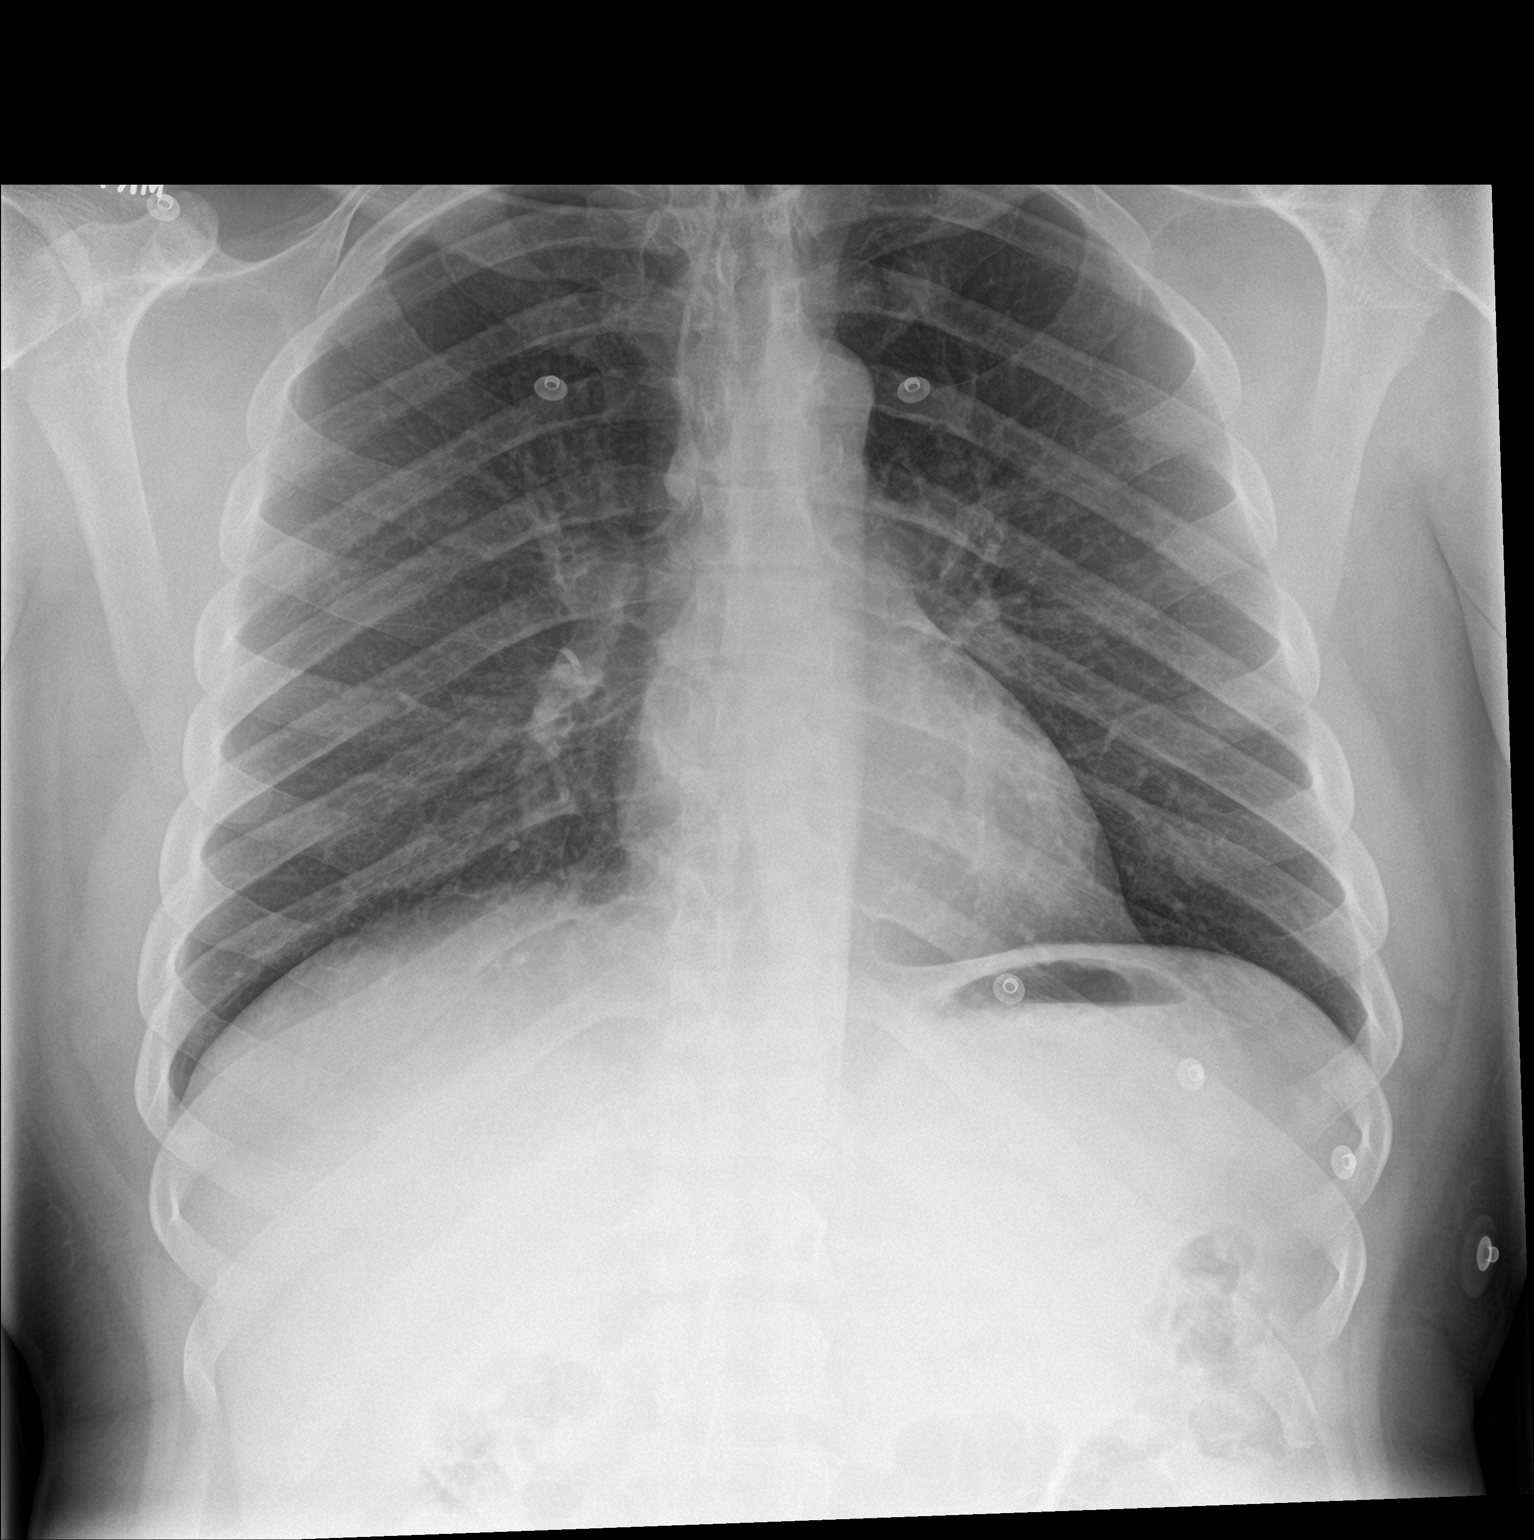

[chest lat]
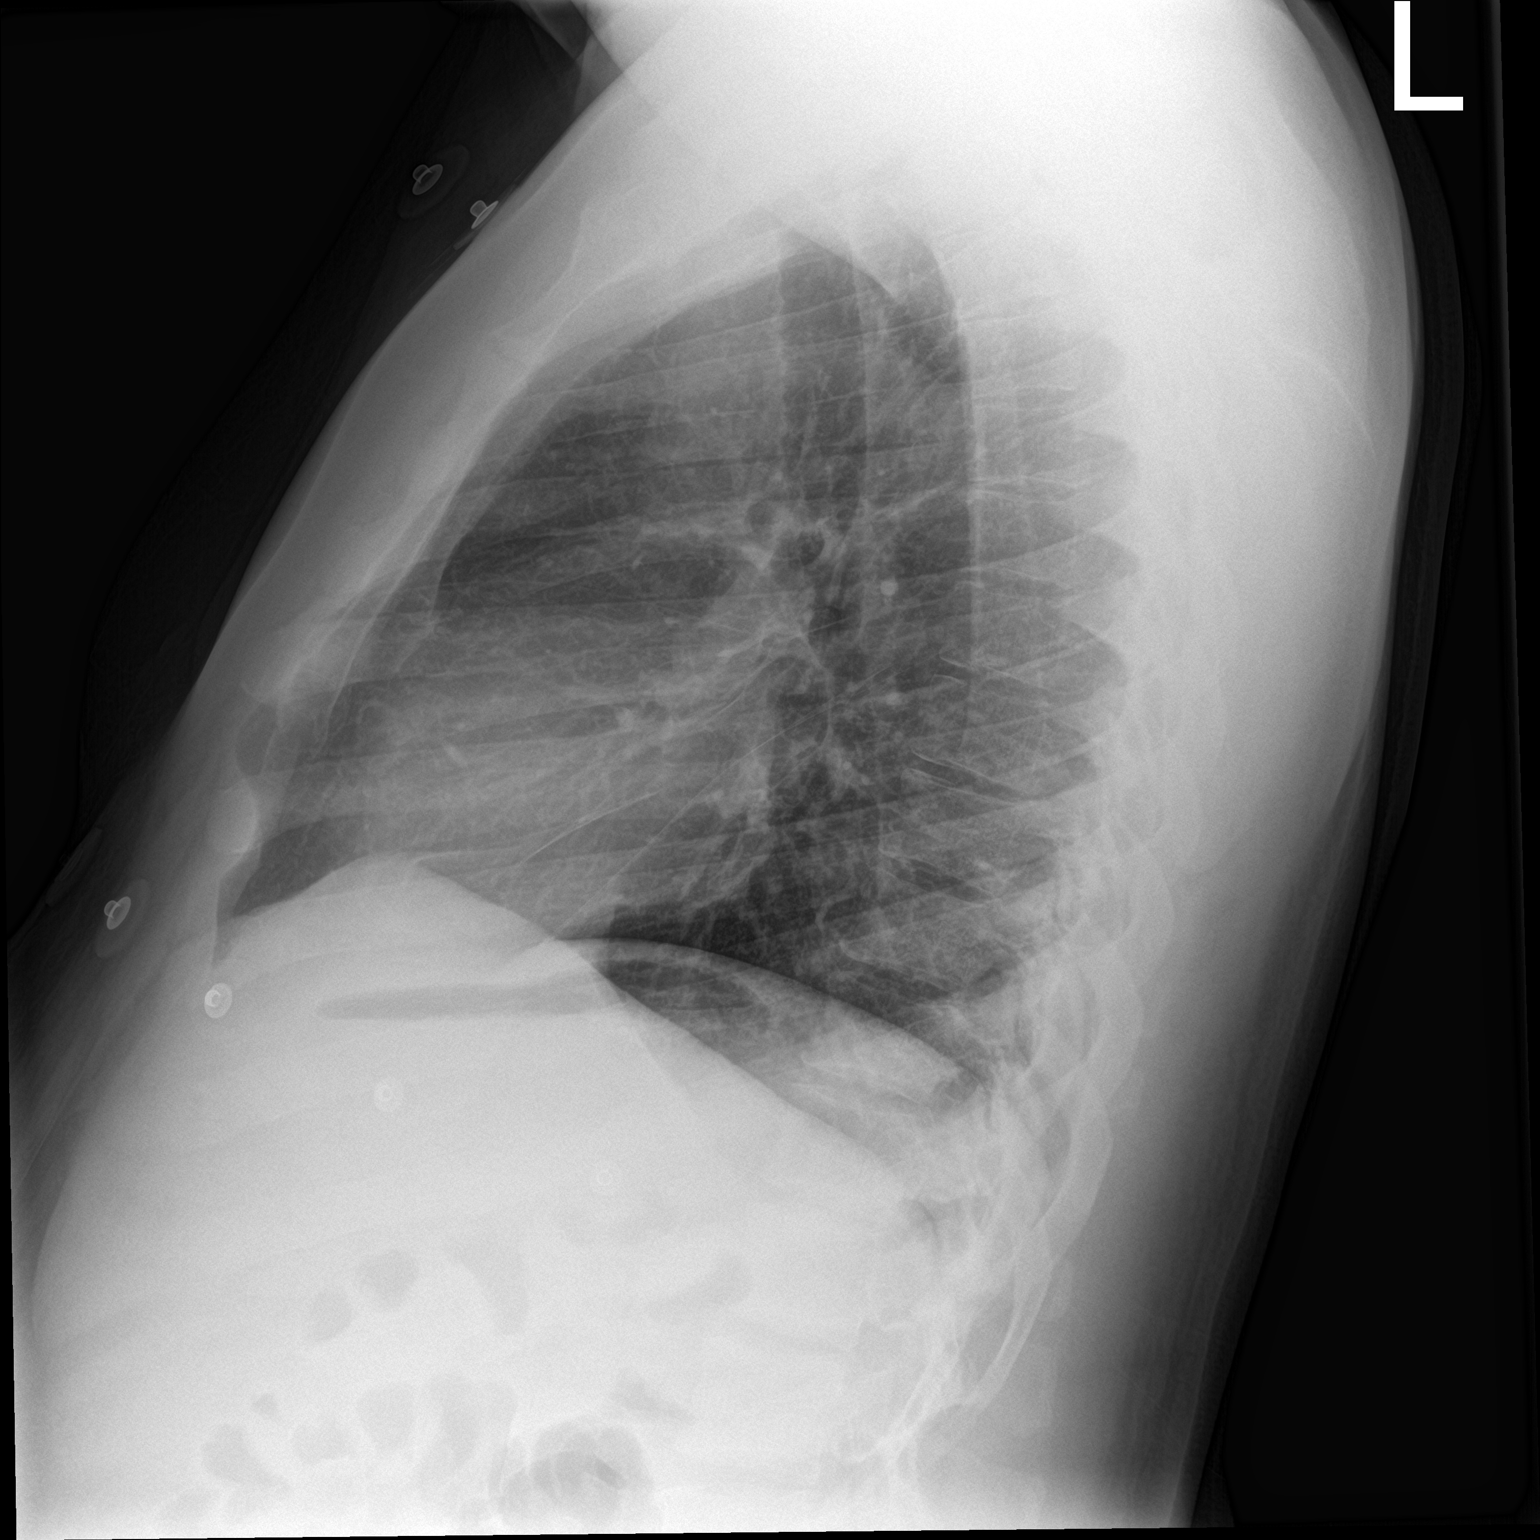

[2 of 2 positions shown; findings below may reference images not displayed]

FINDINGS: Normal mediastinum and cardiac silhouette. Normal pulmonary
vasculature. No evidence of effusion, infiltrate, or pneumothorax.
No acute bony abnormality.
IMPRESSION: No acute cardiopulmonary process.

## 2020-12-10 ENCOUNTER — Other Ambulatory Visit: Payer: Self-pay

## 2020-12-10 ENCOUNTER — Emergency Department: Payer: Medicare HMO

## 2020-12-10 ENCOUNTER — Emergency Department
Admission: EM | Admit: 2020-12-10 | Discharge: 2020-12-10 | Disposition: A | Discharge status: Home / Self Care | Payer: Medicare HMO | Attending: Emergency Medicine | Admitting: Emergency Medicine

## 2020-12-10 DIAGNOSIS — M79651 Pain in right thigh: Secondary | ICD-10-CM | POA: Diagnosis present

## 2020-12-10 DIAGNOSIS — M79604 Pain in right leg: Secondary | ICD-10-CM

## 2020-12-10 DIAGNOSIS — I10 Essential (primary) hypertension: Secondary | ICD-10-CM | POA: Insufficient documentation

## 2020-12-10 DIAGNOSIS — M79606 Pain in leg, unspecified: Secondary | ICD-10-CM

## 2020-12-10 DIAGNOSIS — F17210 Nicotine dependence, cigarettes, uncomplicated: Secondary | ICD-10-CM | POA: Diagnosis not present

## 2020-12-10 MED ORDER — NAPROXEN 500 MG PO TABS: 500.0000 mg | ORAL_TABLET | Freq: Two times a day (BID) | ORAL | 0 refills | Status: DC

## 2020-12-10 MED ORDER — NAPROXEN 500 MG PO TABS
500.0000 mg | ORAL_TABLET | Freq: Once | ORAL | Status: AC
  Administered 2020-12-10: 500 mg via ORAL
  Filled 2020-12-10: qty 1

## 2020-12-10 NOTE — ED Triage Notes (Signed)
R side pain from r hip down to calf has some pain in back as well. Has tried tylenol and sleeping medicine.  He came to ed yesterday but left before being seen. Throbbing anf aching. 8/10 consistent pain.
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
## 2021-03-08 ENCOUNTER — Other Ambulatory Visit: Payer: Self-pay

## 2021-03-08 ENCOUNTER — Emergency Department (EMERGENCY_DEPARTMENT_HOSPITAL)
Admission: EM | Admit: 2021-03-08 | Discharge: 2021-03-10 | Disposition: A | Discharge status: Home / Self Care | Payer: Medicare Other | Source: Home / Self Care | Attending: Emergency Medicine | Admitting: Emergency Medicine

## 2021-03-08 ENCOUNTER — Encounter: Payer: Self-pay | Admitting: Emergency Medicine

## 2021-03-08 DIAGNOSIS — F1410 Cocaine abuse, uncomplicated: Secondary | ICD-10-CM | POA: Insufficient documentation

## 2021-03-08 DIAGNOSIS — F17210 Nicotine dependence, cigarettes, uncomplicated: Secondary | ICD-10-CM | POA: Insufficient documentation

## 2021-03-08 DIAGNOSIS — R519 Headache, unspecified: Secondary | ICD-10-CM | POA: Insufficient documentation

## 2021-03-08 DIAGNOSIS — I10 Essential (primary) hypertension: Secondary | ICD-10-CM | POA: Insufficient documentation

## 2021-03-08 DIAGNOSIS — F339 Major depressive disorder, recurrent, unspecified: Secondary | ICD-10-CM | POA: Diagnosis present

## 2021-03-08 DIAGNOSIS — G8929 Other chronic pain: Secondary | ICD-10-CM | POA: Insufficient documentation

## 2021-03-08 DIAGNOSIS — F332 Major depressive disorder, recurrent severe without psychotic features: Secondary | ICD-10-CM | POA: Diagnosis not present

## 2021-03-08 DIAGNOSIS — Z20822 Contact with and (suspected) exposure to covid-19: Secondary | ICD-10-CM | POA: Insufficient documentation

## 2021-03-08 LAB — COMPREHENSIVE METABOLIC PANEL WITH GFR
ALT: 24 U/L (ref 0–44)
AST: 26 U/L (ref 15–41)
Albumin: 3.7 g/dL (ref 3.5–5.0)
Alkaline Phosphatase: 43 U/L (ref 38–126)
Anion gap: 6 (ref 5–15)
BUN: 12 mg/dL (ref 6–20)
CO2: 28 mmol/L (ref 22–32)
Calcium: 8.7 mg/dL — ABNORMAL LOW (ref 8.9–10.3)
Chloride: 105 mmol/L (ref 98–111)
Creatinine, Ser: 1.12 mg/dL (ref 0.61–1.24)
GFR, Estimated: >60 mL/min
Glucose, Bld: 92 mg/dL (ref 70–99)
Potassium: 3.6 mmol/L (ref 3.5–5.1)
Sodium: 139 mmol/L (ref 135–145)
Total Bilirubin: 0.7 mg/dL (ref 0.3–1.2)
Total Protein: 6.1 g/dL — ABNORMAL LOW (ref 6.5–8.1)

## 2021-03-08 LAB — CBC
HCT: 44.1 % (ref 39.0–52.0)
Hemoglobin: 15.1 g/dL (ref 13.0–17.0)
MCH: 31.7 pg (ref 26.0–34.0)
MCHC: 34.2 g/dL (ref 30.0–36.0)
MCV: 92.5 fL (ref 80.0–100.0)
Platelets: 251 K/uL (ref 150–400)
RBC: 4.77 MIL/uL (ref 4.22–5.81)
RDW: 13.3 % (ref 11.5–15.5)
WBC: 13.6 K/uL — ABNORMAL HIGH (ref 4.0–10.5)
nRBC: 0 % (ref 0.0–0.2)

## 2021-03-08 LAB — ETHANOL: Alcohol, Ethyl (B): <10 mg/dL

## 2021-03-08 MED ORDER — ACETAMINOPHEN 500 MG PO TABS
1000.0000 mg | ORAL_TABLET | Freq: Once | ORAL | Status: AC
  Administered 2021-03-08: 1000 mg via ORAL
  Filled 2021-03-08: qty 2
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
## 2021-03-09 LAB — RESP PANEL BY RT-PCR (FLU A&B, COVID) ARPGX2
Influenza A by PCR: NEGATIVE
Influenza B by PCR: NEGATIVE
SARS Coronavirus 2 by RT PCR: NEGATIVE

## 2021-03-09 NOTE — ED Notes (Signed)
Pt given water and graham crackers. Pt has no other needs at this time. Will continue to monitor.

## 2021-03-09 NOTE — ED Notes (Signed)
Hourly rounding reveals patient in room. No complaints, stable, in no acute distress. Q15 minute rounds and monitoring via Security Cameras to continue. 

## 2021-03-09 NOTE — ED Provider Notes (Signed)
Emergency Medicine Observation Re-evaluation Note  Troy Wiley is a 50 y.o. male, seen on rounds today.  Pt initially presented to the ED for complaints of detox Currently, the patient is sleeping.  Physical Exam  BP (!) 168/95 (BP Location: Right Arm)   Pulse 76   Temp 98.6 F (37 C) (Oral)   Resp 18   Ht 5\' 9"  (1.753 m)   Wt 87.5 kg   SpO2 100%   BMI 28.50 kg/m  Physical Exam Gen: No acute distress  Resp: Normal rise and fall of chest Neuro: Moving all four extremities Psych: Resting currently, calm and cooperative when awake    ED Course / MDM  EKG:   I have reviewed the labs performed to date as well as medications administered while in observation.  Recent changes in the last 24 hours include no acute events overnight.  Plan  Current plan is for TTS disposition, possible inpatient detox.  Troy Wiley is not under involuntary commitment.     Troy Wiley, , DO 03/09/21 [phone]

## 2021-03-09 NOTE — BH Assessment (Signed)
Referral information for Inpatient substance abuse treatment faxed to:  [facility] [phone])  [facility] [phone])  [facility] [phone]- [phone]),   [facility] [phone])  [facility] [phone])    [facility] [phone])

## 2021-03-09 NOTE — ED Notes (Signed)
Lunch tray given. 

## 2021-03-09 NOTE — ED Notes (Signed)
Snack and beverage given. 

## 2021-03-09 NOTE — ED Notes (Signed)
Breakfast tray given. °
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
## 2021-03-09 NOTE — BH Assessment (Addendum)
Comprehensive Clinical Assessment (CCA) Note  03/09/2021 Troy Wiley [id number] Recommendations for Services/Supports/Treatments: Pt to be referred out for inpatient substance abuse treatment.  Troy Wiley is a 50 year old, English speaking, black male with a history of Major depressive disorder, and cannabis abuse. Pt presented to [facility] ED voluntarily requesting detox. The pt admitted that he smokes about 2 grams of crack daily or when there is access, but drinks sporadically. The reported that he has attempted substance abuse treatment in the past but was unable to identify where he'd gone. The pt denied withdrawal symptoms. The pt had good insight and judgement as he admitted that his cocaine use is problematic and he requested help with recovery. The pt is not connected to any services. Pt was calm and cooperative. Pt was not responding to internal/external stimuli. Pt had clear and coherent speech and thoughts were linear. Pt was oriented x4. Pt presented with a depressed mood; affect was congruent. Pt had a relatively unremarkable appearance. Pt's BAL was <10. UDS remains pending. Pt denied current SI/HI/AV/H.   Chief Complaint:  Chief Complaint  Patient presents with   detox   Visit Diagnosis: Cocaine use disorder, severe    CCA Screening, Triage and Referral (STR)  Patient Reported Information How did you hear about [place]? Self  Referral name: No data recorded Referral phone number: No data recorded  Whom do you see for routine medical problems? Other (Comment)  Practice/Facility Name: No data recorded Practice/Facility Phone Number: No data recorded Name of Contact: No data recorded Contact Number: No data recorded Contact Fax Number: No data recorded Prescriber Name: No data recorded Prescriber Address (if known): No data recorded  What Is the Reason for Your Visit/Call Today? Detox  How Long Has This Been Causing You Problems? > than 6 months  What Do You Feel  Would Help You the Most Today? Alcohol or Drug Use Treatment   Have You Recently Been in Any Inpatient Treatment (Hospital/Detox/Crisis Center/28-Day Program)? No  Name/Location of Program/Hospital:No data recorded How Long Were You There? No data recorded When Were You Discharged? No data recorded  Have You Ever Received Services From [facility] Before? Yes  Who Do You See at [facility]? Inpatient treatment   Have You Recently Had Any Thoughts About Hurting Yourself? No  Are You Planning to Commit Suicide/Harm Yourself At This time? No   Have you Recently Had Thoughts About Hurting Someone Troy Wiley? No  Explanation: No data recorded  Have You Used Any Alcohol or Drugs in the Past 24 Hours? Yes  How Long Ago Did You Use Drugs or Alcohol? 0524  What Did You Use and How Much? Cocaine   Do You Currently Have a Therapist/Psychiatrist? No  Name of Therapist/Psychiatrist: No data recorded  Have You Been Recently Discharged From Any Office Practice or Programs? No  Explanation of Discharge From Practice/Program: No data recorded    CCA Screening Triage Referral Assessment Type of Contact: Face-to-Face  Is this Initial or Reassessment? No data recorded Date Telepsych consult ordered in [facility]:  No data recorded Time Telepsych consult ordered in [facility]:  No data recorded  Patient Reported Information Reviewed? Yes  Patient Left Without Being Seen? No data recorded Reason for Not Completing Assessment: No data recorded  Collateral Involvement: None provided   Does Patient Have a Court Appointed Legal Guardian? No data recorded Name and Contact of Legal Guardian: No data recorded If Minor and Not Living with Parent(s), Who has Custody? No data recorded Is CPS  involved or ever been involved? Never  Is APS involved or ever been involved? Never   Patient Determined To Be At Risk for Harm To Self or Others Based on Review of Patient Reported Information or Presenting Complaint?  No  Method: No data recorded Availability of Means: No data recorded Intent: No data recorded Notification Required: No data recorded Additional Information for Danger to Others Potential: No data recorded Additional Comments for Danger to Others Potential: No data recorded Are There Guns or Other Weapons in Your Home? No data recorded Types of Guns/Weapons: No data recorded Are These Weapons Safely Secured?                            No data recorded Who Could Verify You Are Able To Have These Secured: No data recorded Do You Have any Outstanding Charges, Pending Court Dates, Parole/Probation? No data recorded Contacted To Inform of Risk of Harm To Self or Others: No data recorded  Location of Assessment: [facility] ED   Does Patient Present under Involuntary Commitment? No  IVC Papers Initial File Date: No data recorded  [place] of Residence: [place]   Patient Currently Receiving the Following Services: Not Receiving Services   Determination of Need: Emergent (2 hours)   Options For Referral: Therapeutic Triage Services     CCA Biopsychosocial Intake/Chief Complaint:  Patient is presenting to [facility] ED requesting detox treatment  Current Symptoms/Problems: Patient is presenting to [facility] ED requesting detox treatment   Patient Reported Schizophrenia/Schizoaffective Diagnosis in Past: No   Strengths: Patient is able to communicate his needs  Preferences: Unknown  Abilities: Patient is able to communicate his needs   Type of Services Patient Feels are Needed: Detox   Initial Clinical Notes/Concerns: None   Mental Health Symptoms Depression:   Change in energy/activity; Sleep (too much or little)   Duration of Depressive symptoms:  Greater than two weeks   Mania:   None   Anxiety:    None   Psychosis:   None   Duration of Psychotic symptoms: No data recorded  Trauma:   None   Obsessions:   None   Compulsions:   None   Inattention:   None    Hyperactivity/Impulsivity:   N/A   Oppositional/Defiant Behaviors:   None   Emotional Irregularity:   None   Other Mood/Personality Symptoms:  No data recorded   Mental Status Exam Appearance and self-care  Stature:   Average   Weight:   Average weight   Clothing:   Casual   Grooming:   Neglected   Cosmetic use:   None   Posture/gait:   Normal   Motor activity:   Not Remarkable   Sensorium  Attention:   Normal   Concentration:   Normal   Orientation:   X5   Recall/memory:   Normal   Affect and Mood  Affect:   Appropriate   Mood:   Depressed   Relating  Eye contact:   Normal   Facial expression:   Responsive   Attitude toward examiner:   Cooperative   Thought and Language  Speech flow:  Clear and Coherent   Thought content:   Appropriate to Mood and Circumstances   Preoccupation:   None   Hallucinations:   None   Organization:  No data recorded  [employer] of Knowledge:   Fair   Intelligence:   Average   Abstraction:   Normal  Judgement:   Fair   Reality Testing:   Adequate   Insight:   Fair   Decision Making:   Normal   Social Functioning  Social Maturity:   Responsible   Social Judgement:   Normal   Stress  Stressors:   Other (Comment)   Coping Ability:   Normal   Skill Deficits:   None   Supports:   Support needed     Religion: Religion/Spirituality Are You A Religious Person?: No  Leisure/Recreation: Leisure / Recreation Do You Have Hobbies?: No  Exercise/Diet: Exercise/Diet Do You Exercise?: No Have You Gained or Lost A Significant Amount of Weight in the Past Six Months?: No Do You Follow a Special Diet?: No Do You Have Any Trouble Sleeping?: No   CCA Employment/Education Employment/Work Situation: Employment / Work Systems developer: On disability Why is Patient on Disability: Unknown How Long has Patient Been on Disability:  Unknown Patient's Job has Been Impacted by Current Illness: No Has Patient ever Been in the [employer]?: No  Education: Education Is Patient Currently Attending School?: No Did Theme park manager?:  (Unknown) Did You Have An Individualized Education Program (IIEP): No Did You Have Any Difficulty At School?: No Patient's Education Has Been Impacted by Current Illness: No   CCA Family/Childhood History Family and Relationship History: Family history Marital status: Single Does patient have children?:  (unknown)  Childhood History:  Childhood History By whom was/is the patient raised?: Other (Comment) Did patient suffer from severe childhood neglect?: No Has patient ever been sexually abused/assaulted/raped as an adolescent or adult?: No Was the patient ever a victim of a crime or a disaster?: No Witnessed domestic violence?: No Has patient been affected by domestic violence as an adult?: No  Child/Adolescent Assessment:     CCA Substance Use Alcohol/Drug Use: Alcohol / Drug Use Pain Medications: See MAR Prescriptions: See MAR Over the Counter: See MAR History of alcohol / drug use?: Yes Longest period of sobriety (when/how long): Unable to quantify Negative Consequences of Use: Financial Withdrawal Symptoms: Agitation                         ASAM's:  Six Dimensions of Multidimensional Assessment  Dimension 1:  Acute Intoxication and/or Withdrawal Potential:   Dimension 1:  Description of individual's past and current experiences of substance use and withdrawal: Pt has a long hx of cocaine abuse  Dimension 2:  Biomedical Conditions and Complications:      Dimension 3:  Emotional, Behavioral, or Cognitive Conditions and Complications:     Dimension 4:  Readiness to Change:     Dimension 5:  Relapse, Continued use, or Continued Problem Potential:     Dimension 6:  Recovery/Living Environment:     ASAM Severity Score: ASAM's Severity Rating Score: 12  ASAM  Recommended Level of Treatment: ASAM Recommended Level of Treatment: Level III Residential Treatment   Substance use Disorder (SUD) Substance Use Disorder (SUD)  Checklist Symptoms of Substance Use: Continued use despite having a persistent/recurrent physical/psychological problem caused/exacerbated by use, Continued use despite persistent or recurrent social, interpersonal problems, caused or exacerbated by use, Evidence of tolerance, Evidence of withdrawal (Comment), Large amounts of time spent to obtain, use or recover from the substance(s), Persistent desire or unsuccessful efforts to cut down or control use, Presence of craving or strong urge to use, Social, occupational, recreational activities given up or reduced due to use, Recurrent use that results in a failure to fulfill  major role obligations (work, school, home), Repeated use in physically hazardous situations, Substance(s) often taken in larger amounts or over longer times than was intended  Recommendations for Services/Supports/Treatments: Recommendations for Services/Supports/Treatments Recommendations For Services/Supports/Treatments: Detox  DSM5 Diagnoses: Patient Active Problem List   Diagnosis Date Noted   MDD (major depressive disorder), recurrent episode (HCC) 01/06/2019   Major depressive disorder, recurrent episode (HCC) 01/04/2019   Cocaine abuse (HCC) 01/28/2018   Chronic pain 01/28/2018    Shiheem Corporan R Joab Carden, LCAS

## 2021-03-09 NOTE — ED Notes (Signed)
Patient resting quietly in room. No noted distress or abnormal behaviors noted. Will continue 15 minute checks. 

## 2021-03-09 NOTE — ED Notes (Signed)
Pt speaking with security officers. Calm at this time.

## 2021-03-09 NOTE — BH Assessment (Signed)
Referral checks:   [facility] [phone]) Staff reports no admissions tonight, check back in the morning 03/10/21   [facility] [phone]) Staff reports no current beds available, staff took down TTS contact information and will contact back on the next shift   [facility] [phone] -or- [phone]), Durenda Age reports no detox beds currently available until Monday 03/11/21   [facility] [phone]) Denied due to facility not able to accept for Cocaine Use   [facility] [phone]) Joni Reining reports admission staff not available until tomorrow 03/10/21 9am-5pm. Referral re-faxed at 9:03pm   [facility] ([phone]) Facility does not accept Medicare

## 2021-03-09 NOTE — ED Notes (Signed)
Unable to obtain vitals due to pt sleeping at this time. Will attempt to wake at later time.

## 2021-03-10 ENCOUNTER — Other Ambulatory Visit: Payer: Self-pay

## 2021-03-10 ENCOUNTER — Encounter: Payer: Self-pay | Admitting: Psychiatry

## 2021-03-10 ENCOUNTER — Inpatient Hospital Stay
Admission: EM | Admit: 2021-03-10 | Discharge: 2021-03-15 | DRG: 885 | Disposition: A | Discharge status: Home / Self Care | Payer: Medicare Other | Source: Intra-hospital | Attending: Behavioral Health | Admitting: Behavioral Health

## 2021-03-10 DIAGNOSIS — F1410 Cocaine abuse, uncomplicated: Secondary | ICD-10-CM | POA: Diagnosis present

## 2021-03-10 DIAGNOSIS — F332 Major depressive disorder, recurrent severe without psychotic features: Secondary | ICD-10-CM

## 2021-03-10 DIAGNOSIS — Z20822 Contact with and (suspected) exposure to covid-19: Secondary | ICD-10-CM | POA: Diagnosis present

## 2021-03-10 DIAGNOSIS — Z8249 Family history of ischemic heart disease and other diseases of the circulatory system: Secondary | ICD-10-CM

## 2021-03-10 DIAGNOSIS — F339 Major depressive disorder, recurrent, unspecified: Secondary | ICD-10-CM | POA: Diagnosis present

## 2021-03-10 DIAGNOSIS — F17210 Nicotine dependence, cigarettes, uncomplicated: Secondary | ICD-10-CM | POA: Diagnosis present

## 2021-03-10 DIAGNOSIS — Z23 Encounter for immunization: Secondary | ICD-10-CM | POA: Diagnosis present

## 2021-03-10 DIAGNOSIS — R45851 Suicidal ideations: Secondary | ICD-10-CM | POA: Diagnosis present

## 2021-03-10 DIAGNOSIS — G44329 Chronic post-traumatic headache, not intractable: Secondary | ICD-10-CM | POA: Diagnosis present

## 2021-03-10 DIAGNOSIS — I10 Essential (primary) hypertension: Secondary | ICD-10-CM | POA: Diagnosis present

## 2021-03-10 DIAGNOSIS — Z818 Family history of other mental and behavioral disorders: Secondary | ICD-10-CM | POA: Diagnosis not present

## 2021-03-10 DIAGNOSIS — G43909 Migraine, unspecified, not intractable, without status migrainosus: Secondary | ICD-10-CM | POA: Diagnosis present

## 2021-03-10 DIAGNOSIS — Z79899 Other long term (current) drug therapy: Secondary | ICD-10-CM | POA: Diagnosis not present

## 2021-03-10 DIAGNOSIS — F419 Anxiety disorder, unspecified: Secondary | ICD-10-CM | POA: Diagnosis present

## 2021-03-10 DIAGNOSIS — G8929 Other chronic pain: Secondary | ICD-10-CM | POA: Diagnosis present

## 2021-03-10 LAB — URINE DRUG SCREEN, QUALITATIVE (ARMC ONLY)
Amphetamines, Ur Screen: NOT DETECTED
Barbiturates, Ur Screen: NOT DETECTED
Benzodiazepine, Ur Scrn: NOT DETECTED
Cannabinoid 50 Ng, Ur ~~LOC~~: NOT DETECTED
Cocaine Metabolite,Ur ~~LOC~~: POSITIVE — AB
MDMA (Ecstasy)Ur Screen: NOT DETECTED
Methadone Scn, Ur: NOT DETECTED
Opiate, Ur Screen: NOT DETECTED
Phencyclidine (PCP) Ur S: NOT DETECTED
Tricyclic, Ur Screen: NOT DETECTED

## 2021-03-10 MED ORDER — HYDROXYZINE HCL 50 MG PO TABS
50.0000 mg | ORAL_TABLET | Freq: Three times a day (TID) | ORAL | Status: DC | PRN
  Administered 2021-03-11 – 2021-03-14 (×2): 50 mg via ORAL
  Filled 2021-03-10 (×2): qty 1

## 2021-03-10 MED ORDER — TRAZODONE HCL 100 MG PO TABS
100.0000 mg | ORAL_TABLET | Freq: Every evening | ORAL | Status: DC | PRN
  Administered 2021-03-11 – 2021-03-14 (×4): 100 mg via ORAL
  Filled 2021-03-10 (×4): qty 1

## 2021-03-10 MED ORDER — AMLODIPINE BESYLATE 5 MG PO TABS
5.0000 mg | ORAL_TABLET | Freq: Every day | ORAL | Status: DC
  Administered 2021-03-11 – 2021-03-15 (×5): 5 mg via ORAL
  Filled 2021-03-10 (×5): qty 1

## 2021-03-10 MED ORDER — MAGNESIUM HYDROXIDE 400 MG/5ML PO SUSP: 30.0000 mL | Freq: Every day | ORAL | Status: DC | PRN

## 2021-03-10 MED ORDER — POTASSIUM CHLORIDE CRYS ER 20 MEQ PO TBCR
20.0000 meq | EXTENDED_RELEASE_TABLET | Freq: Every day | ORAL | Status: DC
  Administered 2021-03-10 – 2021-03-15 (×6): 20 meq via ORAL
  Filled 2021-03-10 (×6): qty 1

## 2021-03-10 MED ORDER — ACETAMINOPHEN 325 MG PO TABS
650.0000 mg | ORAL_TABLET | Freq: Four times a day (QID) | ORAL | Status: DC | PRN
  Administered 2021-03-11 – 2021-03-12 (×6): 650 mg via ORAL
  Filled 2021-03-10 (×6): qty 2

## 2021-03-10 MED ORDER — ESCITALOPRAM OXALATE 10 MG PO TABS
10.0000 mg | ORAL_TABLET | Freq: Every day | ORAL | Status: DC
  Administered 2021-03-10 – 2021-03-11 (×2): 10 mg via ORAL
  Filled 2021-03-10 (×2): qty 1

## 2021-03-10 MED ORDER — NICOTINE 14 MG/24HR TD PT24
14.0000 mg | MEDICATED_PATCH | Freq: Every day | TRANSDERMAL | Status: DC
  Administered 2021-03-11 – 2021-03-15 (×5): 14 mg via TRANSDERMAL
  Filled 2021-03-10 (×5): qty 1

## 2021-03-10 MED ORDER — IBUPROFEN 600 MG PO TABS
600.0000 mg | ORAL_TABLET | Freq: Once | ORAL | Status: AC
  Administered 2021-03-10: 600 mg via ORAL
  Filled 2021-03-10: qty 1

## 2021-03-10 MED ORDER — ACETAMINOPHEN 500 MG PO TABS
1000.0000 mg | ORAL_TABLET | Freq: Once | ORAL | Status: AC
  Administered 2021-03-10: 1000 mg via ORAL
  Filled 2021-03-10: qty 2

## 2021-03-10 MED ORDER — TOPIRAMATE 25 MG PO TABS
50.0000 mg | ORAL_TABLET | Freq: Two times a day (BID) | ORAL | Status: DC
  Administered 2021-03-10 – 2021-03-15 (×10): 50 mg via ORAL
  Filled 2021-03-10 (×10): qty 2

## 2021-03-10 MED ORDER — PNEUMOCOCCAL VAC POLYVALENT 25 MCG/0.5ML IJ INJ
0.5000 mL | INJECTION | INTRAMUSCULAR | Status: AC
  Administered 2021-03-11: 0.5 mL via INTRAMUSCULAR
  Filled 2021-03-10: qty 0.5

## 2021-03-10 MED ORDER — AMLODIPINE BESYLATE 5 MG PO TABS
5.0000 mg | ORAL_TABLET | Freq: Every day | ORAL | Status: DC
  Administered 2021-03-10: 5 mg via ORAL
  Filled 2021-03-10: qty 1

## 2021-03-10 MED ORDER — HYDROCHLOROTHIAZIDE 25 MG PO TABS
25.0000 mg | ORAL_TABLET | Freq: Every day | ORAL | Status: DC
  Administered 2021-03-10 – 2021-03-15 (×6): 25 mg via ORAL
  Filled 2021-03-10 (×6): qty 1

## 2021-03-10 MED ORDER — ALUM & MAG HYDROXIDE-SIMETH 200-200-20 MG/5ML PO SUSP: 30.0000 mL | ORAL | Status: DC | PRN

## 2021-03-10 NOTE — ED Notes (Signed)
Hourly rounding reveals patient in room. No complaints, stable, in no acute distress. Q15 minute rounds and monitoring via Security Cameras to continue. 

## 2021-03-10 NOTE — ED Notes (Signed)
Pt has signed voluntary consent to be admitted to BMU.

## 2021-03-10 NOTE — ED Notes (Signed)
Breakfast tray given. °

## 2021-03-10 NOTE — ED Notes (Signed)
VOL, pending placement 

## 2021-03-10 NOTE — ED Notes (Signed)
EDP made aware pt's home medications had been reconciled by pharmacy.

## 2021-03-10 NOTE — ED Notes (Signed)
Hourly rounding reveals patient in room. No complaints, stable, in no acute distress. Q15 minute rounds and monitoring via Rover and Officer to continue.   

## 2021-03-10 NOTE — Tx Team (Signed)
Initial Treatment Plan 03/10/2021 6:49 PM Troy Wiley [id number]    PATIENT STRESSORS: Financial difficulties   Health problems   Legal issue   Substance abuse     PATIENT STRENGTHS: Motivation for treatment/growth  Supportive family/friends    PATIENT IDENTIFIED PROBLEMS: Ineffective coping skills  Substance Abuse                   DISCHARGE CRITERIA:  Improved stabilization in mood, thinking, and/or behavior Motivation to continue treatment in a less acute level of care  PRELIMINARY DISCHARGE PLAN: Attend 12-step recovery group Outpatient therapy  PATIENT/FAMILY INVOLVEMENT: This treatment plan has been presented to and reviewed with the patient, Troy Wiley, and/or family member.  The patient and family have been given the opportunity to ask questions and make suggestions.  Troy Mons, RN 03/10/2021, 6:49 PM

## 2021-03-10 NOTE — ED Notes (Signed)
Pt requesting medication for a headache.  EDP made aware.

## 2021-03-10 NOTE — ED Notes (Signed)
Pt discharged to BMU.  VS stable.  Belongings sent with patient.   
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
## 2021-03-10 NOTE — ED Notes (Signed)
VS not taken, patient asleep 

## 2021-03-10 NOTE — ED Provider Notes (Signed)
Emergency Medicine Observation Re-evaluation Note  Troy Wiley is a 50 y.o. male, seen on rounds today.  Pt initially presented to the ED for complaints of detox Currently, the patient is resting comfortably.  Physical Exam  BP 127/78 (BP Location: Left Arm)   Pulse 70   Temp 98.3 F (36.8 C) (Oral)   Resp 19   Ht 5\' 9"  (1.753 m)   Wt 87.5 kg   SpO2 98%   BMI 28.50 kg/m  Physical Exam Constitutional:      Appearance: He is not ill-appearing or toxic-appearing.  HENT:     Head: Atraumatic.  Cardiovascular:     Comments: Appears well perfused Pulmonary:     Effort: Pulmonary effort is normal.  Abdominal:     General: There is no distension.  Musculoskeletal:        General: No deformity.  Neurological:     General: No focal deficit present.     ED Course / MDM  EKG:   I have reviewed the labs performed to date as well as medications administered while in observation.  Recent changes in the last 24 hours include none.  Plan  Current plan is for etoh detox placement.  [facility] is not under involuntary commitment.     , MD 03/10/21 [phone]
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
## 2021-03-11 DIAGNOSIS — F332 Major depressive disorder, recurrent severe without psychotic features: Secondary | ICD-10-CM | POA: Diagnosis not present

## 2021-03-11 MED ORDER — NORTRIPTYLINE HCL 10 MG PO CAPS
10.0000 mg | ORAL_CAPSULE | Freq: Every day | ORAL | Status: DC
  Administered 2021-03-11 – 2021-03-12 (×2): 10 mg via ORAL
  Filled 2021-03-11 (×3): qty 1

## 2021-03-11 NOTE — H&P (Signed)
CSW team provided patient with referrals for SA treatment.  Referrals were sent to [facility], [facility] and [facility].  CSW received confirmation that the fax was successful.  Penni Homans, MSW, LCSW 03/11/2021 3:38 PM

## 2021-03-11 NOTE — Progress Notes (Signed)
Recreation Therapy Notes  INPATIENT RECREATION TR PLAN  Patient Details Name: Troy Wiley MRN: [id number] DOB: 02-Sep-1970 Today's Date: 03/11/2021  Rec Therapy Plan Is patient appropriate for Therapeutic Recreation?: Yes Treatment times per week: at least 3 Estimated Length of Stay: 5-7 days TR Treatment/Interventions: Group participation (Comment)  Discharge Criteria Pt will be discharged from therapy if:: Discharged Treatment plan/goals/alternatives discussed and agreed upon by:: Patient/family  Discharge Summary     [name] 03/11/2021, 11:06 AM

## 2021-03-11 NOTE — Progress Notes (Signed)
D: Patient alert and oriented x 4, denies SI/HI/AVH, he is pleasant and cooperative, he appears less anxious and he is interacting with peers and staff appropriately.  A: Pt was offered support and encouragement. Pt was given scheduled medications. Pt was encouraged to attend groups. Q 15 minute checks were done for safety.  R:Pt attends groups and interacts well with peers and staff. Pt is taking medication. Pt has no complaints.Pt receptive to treatment and safety maintained on unit.

## 2021-03-11 NOTE — BH IP Treatment Plan (Signed)
Interdisciplinary Treatment and Diagnostic Plan Update  03/11/2021 Time of Session: 9:00AM LOXLEY SCHMALE MRN: [id number]  Principal Diagnosis: Severe recurrent major depression without psychotic features [facility])  Secondary Diagnoses: Principal Problem:   Severe recurrent major depression without psychotic features ([place]) Active Problems:   Cocaine abuse (HCC)   Chronic pain   Chronic post-traumatic headache, not intractable   Current Medications:  Current Facility-Administered Medications  Medication Dose Route Frequency Provider Last Rate Last Admin   acetaminophen (TYLENOL) tablet 650 mg  650 mg Oral Q6H PRN Clapacs, Madie Reno, MD   650 mg at 03/11/21 0811   alum & mag hydroxide-simeth (MAALOX/MYLANTA) 200-200-20 MG/5ML suspension 30 mL  30 mL Oral Q4H PRN Clapacs, Madie Reno, MD       amLODipine (NORVASC) tablet 5 mg  5 mg Oral Daily Clapacs, Madie Reno, MD   5 mg at 03/11/21 5916   hydrochlorothiazide (HYDRODIURIL) tablet 25 mg  25 mg Oral Daily He, Jun, MD   25 mg at 03/11/21 0810   hydrOXYzine (ATARAX/VISTARIL) tablet 50 mg  50 mg Oral TID PRN Clapacs, Madie Reno, MD       magnesium hydroxide (MILK OF MAGNESIA) suspension 30 mL  30 mL Oral Daily PRN Clapacs, Madie Reno, MD       nicotine (NICODERM CQ - dosed in mg/24 hours) patch 14 mg  14 mg Transdermal Daily Salley Scarlet, MD   14 mg at 03/11/21 3846   nortriptyline (PAMELOR) capsule 10 mg  10 mg Oral QHS Salley Scarlet, MD       potassium chloride SA (KLOR-CON) CR tablet 20 mEq  20 mEq Oral Daily He, Jun, MD   20 mEq at 03/11/21 6599   topiramate (TOPAMAX) tablet 50 mg  50 mg Oral BID He, Jun, MD   50 mg at 03/11/21 0810   traZODone (DESYREL) tablet 100 mg  100 mg Oral QHS PRN Clapacs, Madie Reno, MD       PTA Medications: Medications Prior to Admission  Medication Sig Dispense Refill Last Dose   acetaminophen (TYLENOL) 500 MG tablet Take 1,000 mg by mouth every 6 (six) hours as needed.      carvedilol (COREG) 25 MG tablet Take 1 tablet  (25 mg total) by mouth 2 (two) times daily with a meal. (Patient not taking: Reported on 03/09/2021) 60 tablet 2    FLUoxetine (PROZAC) 20 MG capsule Take 1 capsule (20 mg total) by mouth daily. (Patient not taking: Reported on 03/09/2021) 90 capsule 1    gabapentin (NEURONTIN) 300 MG capsule Take 1 capsule (300 mg total) by mouth 3 (three) times daily. (Patient not taking: Reported on 03/09/2021) 90 capsule 1    naproxen (NAPROSYN) 500 MG tablet Take 1 tablet (500 mg total) by mouth 2 (two) times daily with a meal. (Patient not taking: Reported on 03/09/2021) 20 tablet 0    topiramate (TOPAMAX) 100 MG tablet Take 1 tablet (100 mg total) by mouth 3 (three) times daily. (Patient not taking: Reported on 03/09/2021) 90 tablet 1     Patient Stressors: Financial difficulties   Health problems   Legal issue   Substance abuse    Patient Strengths: Motivation for treatment/growth  Supportive family/friends   Treatment Modalities: Medication Management, Group therapy, Case management,  1 to 1 session with clinician, Psychoeducation, Recreational therapy.   Physician Treatment Plan for Primary Diagnosis: Severe recurrent major depression without psychotic features ([place]) Long Term Goal(s): Improvement in symptoms so as ready for discharge  Short Term Goals: Ability to identify changes in lifestyle to reduce recurrence of condition will improve Ability to verbalize feelings will improve Ability to disclose and discuss suicidal ideas Ability to demonstrate self-control will improve Ability to identify and develop effective coping behaviors will improve Ability to maintain clinical measurements within normal limits will improve Compliance with prescribed medications will improve Ability to identify triggers associated with substance abuse/mental health issues will improve  Medication Management: Evaluate patient's response, side effects, and tolerance of medication regimen.  Therapeutic  Interventions: 1 to 1 sessions, Unit Group sessions and Medication administration.  Evaluation of Outcomes: Not Met  Physician Treatment Plan for Secondary Diagnosis: Principal Problem:   Severe recurrent major depression without psychotic features ([place]) Active Problems:   Cocaine abuse (HCC)   Chronic pain   Chronic post-traumatic headache, not intractable  Long Term Goal(s): Improvement in symptoms so as ready for discharge   Short Term Goals: Ability to identify changes in lifestyle to reduce recurrence of condition will improve Ability to verbalize feelings will improve Ability to disclose and discuss suicidal ideas Ability to demonstrate self-control will improve Ability to identify and develop effective coping behaviors will improve Ability to maintain clinical measurements within normal limits will improve Compliance with prescribed medications will improve Ability to identify triggers associated with substance abuse/mental health issues will improve     Medication Management: Evaluate patient's response, side effects, and tolerance of medication regimen.  Therapeutic Interventions: 1 to 1 sessions, Unit Group sessions and Medication administration.  Evaluation of Outcomes: Not Met   RN Treatment Plan for Primary Diagnosis: Severe recurrent major depression without psychotic features ([place]) Long Term Goal(s): Knowledge of disease and therapeutic regimen to maintain health will improve  Short Term Goals: Ability to demonstrate self-control, Ability to participate in decision making will improve, Ability to verbalize feelings will improve, Ability to disclose and discuss suicidal ideas, Ability to identify and develop effective coping behaviors will improve, and Compliance with prescribed medications will improve  Medication Management: RN will administer medications as ordered by provider, will assess and evaluate patient's response and provide education to patient for  prescribed medication. RN will report any adverse and/or side effects to prescribing provider.  Therapeutic Interventions: 1 on 1 counseling sessions, Psychoeducation, Medication administration, Evaluate responses to treatment, Monitor vital signs and CBGs as ordered, Perform/monitor CIWA, COWS, AIMS and Fall Risk screenings as ordered, Perform wound care treatments as ordered.  Evaluation of Outcomes: Not Met   LCSW Treatment Plan for Primary Diagnosis: Severe recurrent major depression without psychotic features ([place]) Long Term Goal(s): Safe transition to appropriate next level of care at discharge, Engage patient in therapeutic group addressing interpersonal concerns.  Short Term Goals: Engage patient in aftercare planning with referrals and resources, Increase social support, Increase ability to appropriately verbalize feelings, Increase emotional regulation, Facilitate acceptance of mental health diagnosis and concerns, and Increase skills for wellness and recovery  Therapeutic Interventions: Assess for all discharge needs, 1 to 1 time with Social worker, Explore available resources and support systems, Assess for adequacy in community support network, Educate family and significant other(s) on suicide prevention, Complete Psychosocial Assessment, Interpersonal group therapy.  Evaluation of Outcomes: Not Met   Progress in Treatment: Attending groups: No. Participating in groups: No. Taking medication as prescribed: Yes. Toleration medication: Yes. Family/Significant other contact made: No, will contact:  once permission is given.  Patient understands diagnosis: Yes. Discussing patient identified problems/goals with staff: Yes. Medical problems stabilized or resolved: Yes. Denies suicidal/homicidal ideation:  Yes. Issues/concerns per patient self-inventory: No. Other: none  New problem(s) identified: No, Describe:  none  New Short Term/Long Term Goal(s): detox, elimination of  symptoms of psychosis, medication management for mood stabilization; elimination of SI thoughts; development of comprehensive mental wellness/sobriety plan.   Patient Goals:  "I want to go to a rehab facility"  Discharge Plan or Barriers:  Patient has indicated that he would like to go to a substance abuse rehab facility.    Reason for Continuation of Hospitalization: Anxiety Depression Medical Issues Medication stabilization  Estimated Length of Stay:  1-7 days   Scribe for Treatment Team: Rozann Lesches, LCSW 03/11/2021 11:17 AM

## 2021-03-11 NOTE — Progress Notes (Signed)
Patient alert and oriented. Patient denies SI/HI/AVH. Patient rated pain 8/10. Tylenol was given for pain. Patient rated pain 5/10 when pain was reassessed. Patient interactive with staff upon approach. Patient compliant with medication administration. Patient interactive with peers in the dayroom.  Q15 minute safety checks maintained. Patient remains safe on the unit at this time.
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
## 2021-03-11 NOTE — [facility] Suicide Risk Assessment (Signed)
[facility] INPATIENT:  Family/Significant Other Suicide Prevention Education  Suicide Prevention Education:  Patient Refusal for Family/Significant Other Suicide Prevention Education: The patient Troy Wiley has refused to provide written consent for family/significant other to be provided Family/Significant Other Suicide Prevention Education during admission and/or prior to discharge.  Physician notified.  SPE completed with pt, as pt refused to consent to family contact. SPI pamphlet provided to pt and pt was encouraged to share information with support network, ask questions, and talk about any concerns relating to SPE. Pt denies access to guns/firearms and verbalized understanding of information provided. Mobile Crisis information also provided to pt.    Harden Mo 03/11/2021, 11:47 AM

## 2021-03-11 NOTE — [facility] Group Notes (Signed)
[facility] Group Notes:  (Nursing/MHT/Case Management/Adjunct)  Date:  03/11/2021  Time:  9:52 AM  Type of Therapy:  Community Meeting  Participation Level:  Active  Participation Quality:  Appropriate, Attentive, and Sharing  Affect:  Appropriate  Cognitive:  Alert and Appropriate  Insight:  Appropriate  Engagement in Group:  Engaged  Modes of Intervention:  Education and Orientation  Summary of Progress/Problems:  Troy Wiley 03/11/2021, 9:52 AM

## 2021-03-11 NOTE — Progress Notes (Signed)
Recreation Therapy Notes    Date: 03/11/2021  Time: 9:45 am   Location: Craft room   Behavioral response: Appropriate  Intervention Topic: Self-care   Discussion/Intervention:  Group content today was focused on Self-Care. The group defined self-care and some positive ways they care for themselves. Individuals expressed ways and reasons why they neglected any self-care in the past. Patients described ways to improve self-care in the future. The group explained what could happen if they did not do any self-care activities at all. The group participated in the intervention "self-care assessment" where they had a chance to discover some of their weaknesses and strengths in self- care. Patient came up with a self-care plan to improve themselves in the future.  Clinical Observations/Feedback: Patient came to group late and was focused on what peers and staff had to say about self-care. Individual was social with peers and staff while participating in the intervention.  Romina Divirgilio LRT/CTRS         Bhavin Monjaraz 03/11/2021 11:46 AM

## 2021-03-11 NOTE — Progress Notes (Signed)
Recreation Therapy Notes  INPATIENT RECREATION THERAPY ASSESSMENT  Patient Details Name: Troy Wiley MRN: [id number] DOB: 03-21-1971 Today's Date: 03/11/2021       Information Obtained From: Patient  Able to Participate in Assessment/Interview: Yes  Patient Presentation: Responsive  Reason for Admission (Per Patient): Active Symptoms, Substance Abuse  Patient Stressors:    Coping Skills:   Isolation, Substance Abuse, Avoidance, Other (Comment) (Video games)  Leisure Interests (2+):  Nature - Fishing, Individual - TV, Social - Family, Games - Video games  Frequency of Recreation/Participation: Pharmacist, community Resources:  No  Community Resources:     Current Use: No  If no, Barriers?:    Expressed Interest in [employer] Information: No  [employer] of Residence:  Film/video editor  Patient Main Form of Transportation: Set designer  Patient Strengths:  N/A  Patient Identified Areas of Improvement:  Getting clean  Patient Goal for Hospitalization:  Go to a rehab facility  Current SI (including self-harm):  No  Current HI:  No  Current AVH: No  Staff Intervention Plan: Group Attendance, Collaborate with Interdisciplinary Treatment Team  Consent to Intern Participation: N/A  Tessi Eustache 03/11/2021, 11:04 AM
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
## 2021-03-11 NOTE — [facility] Counselor (Addendum)
ADDENDUM  CSW met with patient to complete applications to SUD residential programs. Applications given to CSW team to be sent to appropriate facilities.   Signed:  Durenda Hurt, MSW, [place], LCASA 03/11/2021 3:35 PM  CSW met with patient at bedside to discuss Residential SUD treatment. Patient was provided applications to [facility], [facility], and [facility] to fill out. CSW will f/u w/ patient later in the day to complete applications and send them to appropriate treatment facilities. Situation ongoing, CSW will continue to monitor and update note as more information becomes available.   Signed:  Durenda Hurt, MSW, [place], LCASA 03/11/2021 2:02 PM

## 2021-03-12 DIAGNOSIS — F332 Major depressive disorder, recurrent severe without psychotic features: Secondary | ICD-10-CM | POA: Diagnosis not present

## 2021-03-12 NOTE — Group Note (Signed)
[facility] LCSW Group Therapy Note   Group Date: 03/12/2021 Start Time: 1300 End Time: 1400  Type of Therapy/Topic:  Group Therapy:  Feelings about Diagnosis  Participation Level:  Did Not Attend   Description of Group:    This group will allow patients to explore their thoughts and feelings about diagnoses they have received. Patients will be guided to explore their level of understanding and acceptance of these diagnoses. Facilitator will encourage patients to process their thoughts and feelings about the reactions of others to their diagnosis, and will guide patients in identifying ways to discuss their diagnosis with significant others in their lives. This group will be process-oriented, with patients participating in exploration of their own experiences as well as giving and receiving support and challenge from other group members.   Therapeutic Goals: 1. Patient will demonstrate understanding of diagnosis as evidence by identifying two or more symptoms of the disorder:  2. Patient will be able to express two feelings regarding the diagnosis 3. Patient will demonstrate ability to communicate their needs through discussion and/or role plays  Summary of Patient Progress: X  Therapeutic Modalities:   Cognitive Behavioral Therapy Brief Therapy Feelings Identification    Nazir Hacker J Reshonda Koerber, LCSW 

## 2021-03-12 NOTE — [facility] Counselor (Signed)
CSW received call from Joe at [facility].  Patient has been DECLINED due to having Medicare.  He requested that more referrals be sent, no male beds at this time.   Penni Homans, MSW, LCSW 03/12/2021 12:54 PM

## 2021-03-12 NOTE — Plan of Care (Signed)
Patient stays in bed most of the shift. Patient stated that his anxiety and depression is getting better. Denies SI,HI and AVH. Patient looking forward for the rehab program. Appetite and energy level good. Patient stated that his chronic headache is controlled with PRN pain medications. Support and encouragement given.

## 2021-03-12 NOTE — Progress Notes (Signed)
Recreation Therapy Notes  Date: 03/12/2021  Time: 9:45 am   Location: Craft room    Behavioral response: N/A   Intervention Topic: Stress Management    Discussion/Intervention: Patient did not attend group.   Clinical Observations/Feedback:  Patient did not attend group.   Baneen Wieseler LRT/CTRS        [name] 03/12/2021 11:08 AM
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
## 2021-03-12 NOTE — Group Note (Signed)
[facility] LCSW Group Therapy Note    Group Date: 03/11/2021 Start Time: 1300 End Time: 1400  Type of Therapy and Topic:  Group Therapy:  Overcoming Obstacles  Participation Level:  [facility] PARTICIPATION LEVEL: Did Not Attend  Mood:  Description of Group:   In this group patients will be encouraged to explore what they see as obstacles to their own wellness and recovery. They will be guided to discuss their thoughts, feelings, and behaviors related to these obstacles. The group will process together ways to cope with barriers, with attention given to specific choices patients can make. Each patient will be challenged to identify changes they are motivated to make in order to overcome their obstacles. This group will be process-oriented, with patients participating in exploration of their own experiences as well as giving and receiving support and challenge from other group members.  Therapeutic Goals: 1. Patient will identify personal and current obstacles as they relate to admission. 2. Patient will identify barriers that currently interfere with their wellness or overcoming obstacles.  3. Patient will identify feelings, thought process and behaviors related to these barriers. 4. Patient will identify two changes they are willing to make to overcome these obstacles:    Summary of Patient Progress   CSW notes that group was not held 03/11/2021 due to acuity on unit.     Therapeutic Modalities:   Cognitive Behavioral Therapy Solution Focused Therapy Motivational Interviewing Relapse Prevention Therapy   Mario Voong J Laquanna Veazey, LCSW 

## 2021-03-12 NOTE — Progress Notes (Signed)
Patient calm and pleasant during assessment denying SI/HI/AVH. Patient observed interacting appropriate with staff and peers on the unit. Patient compliant with medication administration per MD orders. Pt given education, support, and encouragement to be active in his treatment plan. Pt being monitored Q 15 minutes for safety per unit protocol. Pt remains safe on the unit.

## 2021-03-12 NOTE — B[facility] Group Notes (Signed)
[facility] Group Notes:  (Nursing/MHT/Case Management/Adjunct)  Date:  03/12/2021  Time:  9:37 AM  Type of Therapy:  Community Meeting  Participation Level:  Did Not Attend   Lynelle Smoke [facility] 03/12/2021, 9:37 AM

## 2021-03-12 NOTE — Plan of Care (Signed)
Patient stayed in the milieu and maintained a positive attitude. Complained of headache and received Tylenol. He denied thoughts of self-harm. Denied HI.AVH. Received HS medications and went to bed. Currently sleeping with no sign of distress.

## 2021-03-13 DIAGNOSIS — F332 Major depressive disorder, recurrent severe without psychotic features: Secondary | ICD-10-CM | POA: Diagnosis not present

## 2021-03-13 MED ORDER — ACETAMINOPHEN 500 MG PO TABS: 1000.0000 mg | ORAL_TABLET | ORAL | Status: DC

## 2021-03-13 MED ORDER — ACETAMINOPHEN 500 MG PO TABS
1000.0000 mg | ORAL_TABLET | Freq: Three times a day (TID) | ORAL | Status: DC | PRN
  Administered 2021-03-13 – 2021-03-14 (×3): 1000 mg via ORAL
  Filled 2021-03-13 (×3): qty 2

## 2021-03-13 MED ORDER — NORTRIPTYLINE HCL 25 MG PO CAPS
25.0000 mg | ORAL_CAPSULE | Freq: Every day | ORAL | Status: DC
  Administered 2021-03-13 – 2021-03-14 (×2): 25 mg via ORAL
  Filled 2021-03-13 (×3): qty 1

## 2021-03-13 NOTE — Plan of Care (Signed)
Patient during assessments this morning endorsed a normal mood and affect was mildly constricted to euthymic. Pt. Denied si/hi/avh and endorsed ability to continue to remain safe on the unit. Pt. Orientation appeared grossly intact. Pt. Denied physical pain and endorsed toleration of medications thus far. Pt. Endorsed no problems with sleeping and or eating at this time. Pt. Had no complaints for this Clinical research associate. Patient was overall superficially engaging with this Clinical research associate.   Patient has been complaint with medications and unit procedures thus far. Pt. Has been observed eating good thus far. Pt. Has been able to remain safe on the unit thus far. Patient outside of breakfast has been observed meeting with social work and or resting in his room.   Q x 15 minute observation checks in place/maintained for safety. Patient is provided with education throughout shift when appropriate and able.  Patient is given/offered medications per orders. Patient is encouraged to attend groups, participate in unit activities and continue with plan of care. Pt. Chart and plans of care reviewed. Pt. Given support and encouragement when appropriate and able.      Problem: Education: Goal: Knowledge of the prescribed therapeutic regimen will improve Outcome: Progressing Note: Patient verbalizes understanding of information provided.    Problem: Coping: Goal: Will verbalize feelings Outcome: Not Progressing Note: Patient is appreciably guarded and superficially engaged during interactions.    Problem: Health Behavior/Discharge Planning: Goal: Compliance with therapeutic regimen will improve Outcome: Progressing Note: Patient complaint with medications and unit procedures.    Problem: Safety: Goal: Ability to disclose and discuss suicidal ideas will improve Outcome: Progressing Note: Patient denies suicidal and homicidal ideations. Patient endorses an ability to remain safe on the unit.

## 2021-03-13 NOTE — Progress Notes (Signed)
Patient calm and pleasant during assessment denying SI/HI/AVH. Patient observed interacting appropriate with staff and peers on the unit. Patient compliant with medication administration per MD orders. Pt given education, support, and encouragement to be active in his treatment plan. Pt being monitored Q 15 minutes for safety per unit protocol. Pt remains safe on the unit.  
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
## 2021-03-13 NOTE — BHH Counselor (Signed)
CSW met with patient to discuss SUD treatment options. Patient expressed interest in [facility] and [place]. Patient was provided contact information and observed calling them on the phone. CSW provided supporting documentation for referral. Awaiting call back for patient intake screening.   Situation ongoing, CSW will continue to monitor and update note as more information becomes available.   Signed:  Durenda Hurt, MSW, [place], LCASA 03/13/2021 9:51 AM

## 2021-03-13 NOTE — Progress Notes (Signed)
Recreation Therapy Notes   Date: 03/13/2021  Time: 10:00 am  Location: Craft room   Behavioral response: Appropriate  Intervention Topic: Communication   Discussion/Intervention:  Group content today was focused on communication. The group defined communication and ways to communicate with others. Individuals stated reason why communication is important and some reasons to communicate with others. Patients expressed if they thought they were good at communicating with others and ways they could improve their communication skills. The group identified important parts of communication and some experiences they have had in the past with communication. The group participated in the intervention "Words in a Bag", where they had a chance to test out their communication skills and identify ways to improve their communication techniques.  Clinical Observations/Feedback: Patient came to group and was focused on what peers and staff had to say about communication. Individual was social with peers and staff while participating in the intervention.  Shadara Lopez LRT/CTRS         Maksym Pfiffner 03/13/2021 12:12 PM

## 2021-03-13 NOTE — Group Note (Signed)
BHH LCSW Group Therapy Note   Group Date: 03/13/2021 Start Time: 1300 End Time: 1400   Type of Therapy/Topic:  Group Therapy:  Emotion Regulation  Participation Level:  Did Not Attend   Mood:  Description of Group:    The purpose of this group is to assist patients in learning to regulate negative emotions and experience positive emotions. Patients will be guided to discuss ways in which they have been vulnerable to their negative emotions. These vulnerabilities will be juxtaposed with experiences of positive emotions or situations, and patients challenged to use positive emotions to combat negative ones. Special emphasis will be placed on coping with negative emotions in conflict situations, and patients will process healthy conflict resolution skills.  Therapeutic Goals: Patient will identify two positive emotions or experiences to reflect on in order to balance out negative emotions:  Patient will label two or more emotions that they find the most difficult to experience:  Patient will be able to demonstrate positive conflict resolution skills through discussion or role plays:   Summary of Patient Progress: Patient did not attend group despite encouraged participation.     Therapeutic Modalities:   Cognitive Behavioral Therapy Feelings Identification Dialectical Behavioral Therapy   Corky Crafts, [place]

## 2021-03-14 DIAGNOSIS — F332 Major depressive disorder, recurrent severe without psychotic features: Secondary | ICD-10-CM | POA: Diagnosis not present
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
## 2021-03-14 NOTE — Group Note (Signed)
LCSW Group Therapy Note   Group Date: 03/14/2021 Start Time: 1300 End Time: 1400   Type of Therapy and Topic:  Group Therapy:   Participation Level:  Did Not Attend  Description of Group: CSW assisted patients in making SMART goals. CSW opened up group with an ice breaker before starting group in order to increase group cohesion. At the beginning of group we all went around to discuss what are goals were and how to set and achieve each goal. Members were able to provide feedback to each other and amend their goals appropriately.    Therapeutic Goals:  1. Patient will learn how to develop goals.  2. Patient will be able to identify each achieved goal. 3 Patient will be able to tie goal back into reason for hospital admission.    Summary of Patient Progress:    Patient did not attend group despite encouraged participation.       Chelcea Zahn W Akbar Sacra, LCSWA 03/14/2021  2:26 PM    

## 2021-03-14 NOTE — Progress Notes (Signed)
Patient calm and pleasant during assessment denying SI/HI/AVH. Patient observed interacting appropriate with staff and peers on the unit. Patient compliant with medication administration per MD orders. Pt given education, support, and encouragement to be active in his treatment plan. Pt being monitored Q 15 minutes for safety per unit protocol. Pt remains safe on the unit.  

## 2021-03-14 NOTE — BHH Group Notes (Signed)
B[facility] Group Notes:  (Nursing/MHT/Case Management/Adjunct)  Date:  03/14/2021  Time:  11:08 AM  Type of Therapy:  Community Meeting  Participation Level:  Active  Participation Quality:  Appropriate, Attentive, and Sharing  Affect:  Appropriate  Cognitive:  Alert and Appropriate  Insight:  Appropriate  Engagement in Group:  Engaged  Modes of Intervention:  Discussion, Education, and Orientation  Summary of Progress/Problems:  Lynelle Smoke Jari Dipasquale 03/14/2021, 11:08 AM

## 2021-03-14 NOTE — BHH Counselor (Addendum)
CSW met with patient to discuss further placement for SUD residential tx.   Patient reports that he was denied by [facility], states they would not accept him with a sole diagnosis of cocaine (stimulant) use disorder.   Patient refused to be referred to [facility] for SUD tx.   Patient was given phone number for [employer] [facility] and encouraged to call.   CSW called Sharyn Lull [phone] with [facility], however, the facility does not accept patients with dual medicare/ medicaid. Patient mus have Medicare alone.   Situation ongoing, CSW will continue to monitor and update note as more information becomes available.    Signed:  Durenda Hurt, MSW, [place], LCASA 03/14/2021 11:35 AM

## 2021-03-14 NOTE — Progress Notes (Signed)
Recreation Therapy Notes  Date: 03/14/2021  Time: 9:30 am  Location: Craft room    Behavioral response: Appropriate   Intervention Topic: Animal Assisted Therapy   Discussion/Intervention:  Animal Assisted Therapy took place today during group.  Animal Assisted Therapy is the planned inclusion of an animal in a patient's treatment plan. The patients were able to engage in therapy with an animal during group. Participants were educated on what a service dog is and the different between a support dog and a service dog. Patient were informed on how animal needs are like a person needs. Individuals were enlightened on the process to get a service animal or support animal. Patients got the opportunity to pet the animal and were offered emotional support from the animal and staff.  Clinical Observations/Feedback:  Patient came to group and was on topic and was focused on what peers and staff had to say. Participant shared their experiences and history with animals. Individual was social with peers, staff and animal while participating in group.  Letonia Stead LRT/CTRS         Danay Mckellar 03/14/2021 12:36 PM

## 2021-03-14 NOTE — Progress Notes (Signed)
Pt visible on the unit, interacting  peers/staff and attending unit/group activities. Pt reports he is working getting in to drug rehab. He denies SI/HI and AV thoughts. At times he has sad affect. He complained of a HA earlier, but it has resolved.  Discussed support systems and coping skill.

## 2021-03-14 NOTE — BHH Counselor (Signed)
CSW called [employer] to confirm the denial pt reported. CSW spoke with Darl Pikes who confirmed.  CSW encouraged patient to contact [employer] and ask for Alinda Money to complete a interview.  CSW observed the patient to be on the phone.  Patient provided [facility] [employer].  CSW will fax the referral over to Denzil Hughes.   Penni Homans, MSW, LCSW 03/14/2021 1:51 PM

## 2021-03-15 DIAGNOSIS — F332 Major depressive disorder, recurrent severe without psychotic features: Secondary | ICD-10-CM | POA: Diagnosis not present

## 2021-03-15 MED ORDER — AMLODIPINE BESYLATE 5 MG PO TABS: 5.0000 mg | ORAL_TABLET | Freq: Every day | ORAL | 1 refills | Status: DC

## 2021-03-15 MED ORDER — TOPIRAMATE 50 MG PO TABS: 50.0000 mg | ORAL_TABLET | Freq: Two times a day (BID) | ORAL | 1 refills | Status: DC

## 2021-03-15 MED ORDER — NORTRIPTYLINE HCL 25 MG PO CAPS: 25.0000 mg | ORAL_CAPSULE | Freq: Every day | ORAL | 1 refills | Status: DC

## 2021-03-15 NOTE — Plan of Care (Signed)
  Problem: Group Participation Goal: STG - Patient will engage in groups without prompting or encouragement from LRT x3 group sessions within 5 recreation therapy group sessions Description: STG - Patient will engage in groups without prompting or encouragement from LRT x3 group sessions within 5 recreation therapy group sessions Outcome: Completed/Met

## 2021-03-15 NOTE — Progress Notes (Signed)
Patient ID: ESTES LEHNER, male   DOB: 1970-07-28, 50 y.o.   MRN: [id number] Discharge Note  Patient ready for discharge. AVS, Physician Risk Assessment and Hospital Summary reviewed with patient. Patient verbalized understanding. All belongings returned to patient. Patient denies SI/HI/AH/VH at discharge. Patient discharging via safe link. No s/s of distress at discharge.

## 2021-03-15 NOTE — Progress Notes (Signed)
Recreation Therapy Notes   Date: 03/15/2021  Time: 10:15 am   Location: Craft room   Behavioral response: Appropriate  Intervention Topic: Teamwork    Discussion/Intervention:  Group content on today was focused on teamwork. The group identified what teamwork is. Individuals described who is a part of their team. Patients expressed why they thought teamwork is important. The group stated reasons why they thought it was easier to work with a Comptroller team. Individuals discussed some positives and negatives of working with a team. Patients gave examples of past experiences they had while working with a team. The group participated in the intervention "Story in a bag", patients were in groups and were able to test their skill in a team setting.  Clinical Observations/Feedback: Patient came to group and was focused on what peer staff had to say about team work. Individual was social with staff while participating in the intervention.  Troy Wiley LRT/CTRS         Clifton Safley 03/15/2021 12:47 PM

## 2021-03-15 NOTE — Plan of Care (Addendum)
Patient up eating breakfast this am. Denies SI/HI/AH/VH, pain and anxiety. Endorses feeling depressed at baseline.  Takes all medications as prescribed. No s/s of distress. Cont Q 15 minute check for safety.  Problem: Education: Goal: Knowledge of [facility] Education information/materials will improve Outcome: Adequate for Discharge Goal: Emotional status will improve Outcome: Adequate for Discharge Goal: Mental status will improve Outcome: Adequate for Discharge Goal: Verbalization of understanding the information provided will improve Outcome: Adequate for Discharge   Problem: Activity: Goal: Interest or engagement in activities will improve Outcome: Adequate for Discharge Goal: Sleeping patterns will improve Outcome: Adequate for Discharge   Problem: Coping: Goal: Ability to verbalize frustrations and anger appropriately will improve Outcome: Adequate for Discharge Goal: Ability to demonstrate self-control will improve Outcome: Adequate for Discharge   Problem: Health Behavior/Discharge Planning: Goal: Identification of resources available to assist in meeting health care needs will improve Outcome: Adequate for Discharge Goal: Compliance with treatment plan for underlying cause of condition will improve Outcome: Adequate for Discharge   Problem: Physical Regulation: Goal: Ability to maintain clinical measurements within normal limits will improve Outcome: Adequate for Discharge   Problem: Safety: Goal: Periods of time without injury will increase Outcome: Adequate for Discharge   Problem: Education: Goal: Ability to state activities that reduce stress will improve Outcome: Adequate for Discharge   Problem: Coping: Goal: Ability to identify and develop effective coping behavior will improve Outcome: Adequate for Discharge   Problem: Self-Concept: Goal: Ability to identify factors that promote anxiety will improve Outcome: Adequate for Discharge Goal: Level  of anxiety will decrease Outcome: Adequate for Discharge Goal: Ability to modify response to factors that promote anxiety will improve Outcome: Adequate for Discharge   Problem: Education: Goal: Utilization of techniques to improve thought processes will improve Outcome: Adequate for Discharge Goal: Knowledge of the prescribed therapeutic regimen will improve Outcome: Adequate for Discharge   Problem: Activity: Goal: Interest or engagement in leisure activities will improve Outcome: Adequate for Discharge Goal: Imbalance in normal sleep/wake cycle will improve Outcome: Adequate for Discharge   Problem: Coping: Goal: Coping ability will improve Outcome: Adequate for Discharge Goal: Will verbalize feelings Outcome: Adequate for Discharge   Problem: Health Behavior/Discharge Planning: Goal: Ability to make decisions will improve Outcome: Adequate for Discharge Goal: Compliance with therapeutic regimen will improve Outcome: Adequate for Discharge   Problem: Role Relationship: Goal: Will demonstrate positive changes in social behaviors and relationships Outcome: Adequate for Discharge   Problem: Safety: Goal: Ability to disclose and discuss suicidal ideas will improve Outcome: Adequate for Discharge Goal: Ability to identify and utilize support systems that promote safety will improve Outcome: Adequate for Discharge   Problem: Self-Concept: Goal: Will verbalize positive feelings about self Outcome: Adequate for Discharge Goal: Level of anxiety will decrease Outcome: Adequate for Discharge

## 2021-03-15 NOTE — Progress Notes (Signed)
Recreation Therapy Notes  INPATIENT RECREATION TR PLAN  Patient Details Name: Troy Wiley MRN: [id number] DOB: [birth year] Today's Date: 03/15/2021  Rec Therapy Plan Is patient appropriate for Therapeutic Recreation?: Yes Treatment times per week: at least 3 Estimated Length of Stay: 5-7 days TR Treatment/Interventions: Group participation (Comment)  Discharge Criteria Pt will be discharged from therapy if:: Discharged Treatment plan/goals/alternatives discussed and agreed upon by:: Patient/family  Discharge Summary Short term goals set: Patient will engage in groups without prompting or encouragement from LRT x3 group sessions within 5 recreation therapy group sessions Short term goals met: Complete Progress toward goals comments: Groups attended Which groups?: AAA/T, Communication, Other (Comment) (Team work, Self-care) Reason goals not met: N/A Therapeutic equipment acquired: N/A Reason patient discharged from therapy: Discharge from hospital Pt/family agrees with progress & goals achieved: Yes Date patient discharged from therapy: 03/15/21   Roger Fasnacht 03/15/2021, 12:51 PM

## 2021-03-15 NOTE — Progress Notes (Signed)
  [facility] Adult Case Management Discharge Plan :  Will you be returning to the same living situation after discharge:  Yes,  pt reports that he is returning to his sisters home.  At discharge, do you have transportation home?: Yes,  pt indicated that he will take the bus.  Do you have the ability to pay for your medications: Yes,  [employer] Medicare, Medicaid.  Release of information consent forms completed and in the chart;  Patient's signature needed at discharge.  Patient to Follow up at:  Follow-up Information     Pc, [facility] Follow up.   Why: Walk in hours are from 9AM to 4PM on Monday though Friday.  Thanks! Contact information: [address] [place] [address] [phone]                 Next level of care provider has access to [facility] Link:no  Safety Planning and Suicide Prevention discussed: Yes,  SPE completed with the patient. Patient declined collateral contact at this time.      Has patient been referred to the Quitline?: Patient refused referral  Patient has been referred for addiction treatment: Yes  Harden Mo, LCSW 03/15/2021, 9:25 AM

## 2021-03-15 NOTE — BHH Suicide Risk Assessment (Signed)
[facility] Discharge Suicide Risk Assessment   Principal Problem: Severe recurrent major depression without psychotic features [facility]) Discharge Diagnoses: Principal Problem:   Severe recurrent major depression without psychotic features (HCC) Active Problems:   Cocaine abuse (HCC)   Chronic pain   Chronic post-traumatic headache, not intractable   Total Time spent with patient: 35 minutes- 25 minutes face-to-face contact with patient, 10 minutes documentation, coordination of care, scripts   Musculoskeletal: Strength & Muscle Tone: within normal limits Gait & Station: normal Patient leans: N/A  Psychiatric Specialty Exam  Presentation  General Appearance: Appropriate for Environment; Casual  Eye Contact:Good  Speech:Clear and Coherent  Speech Volume:Normal  Handedness:Ambidextrous   Mood and Affect  Mood:Euthymic  Duration of Depression Symptoms: Greater than two weeks  Affect:Congruent   Thought Process  Thought Processes:Coherent; Goal Directed  Descriptions of Associations:Intact  Orientation:Full (Time, Place and Person)  Thought Content:Logical  History of Schizophrenia/Schizoaffective disorder:No  Duration of Psychotic Symptoms:No data recorded Hallucinations:Hallucinations: None  Ideas of Reference:None  Suicidal Thoughts:Suicidal Thoughts: No  Homicidal Thoughts:Homicidal Thoughts: No   Sensorium  Memory:Immediate Fair; Recent Fair; Remote Fair  Judgment:Fair  Insight:Fair   Executive Functions  Concentration:Fair  Attention Span:Fair  Recall:Fair  Fund of Knowledge:Fair  Language:Fair   Psychomotor Activity  Psychomotor Activity:Psychomotor Activity: Normal   Assets  Assets:Communication Skills; Desire for Improvement; Financial Resources/Insurance; Physical Health; Resilience; Social Support   Sleep  Sleep:Sleep: Good Number of Hours of Sleep: 7.5   Physical Exam: Physical Exam ROS Blood pressure 114/86, pulse 83,  temperature 98.2 F (36.8 C), temperature source Oral, resp. rate 17, height 5\' 9"  (1.753 m), weight 84.6 kg, SpO2 100 %. Body mass index is 27.54 kg/m.  Mental Status Per Nursing Assessment::   On Admission:  NA  Demographic Factors:  Male  Loss Factors: Loss of significant relationship  Historical Factors: NA  Risk Reduction Factors:   Sense of responsibility to family, Positive social support, Positive therapeutic relationship, and Positive coping skills or problem solving skills  Continued Clinical Symptoms:  Depression:   Comorbid alcohol abuse/dependence Unstable or Poor Therapeutic Relationship Previous Psychiatric Diagnoses and Treatments  Cognitive Features That Contribute To Risk:  None    Suicide Risk:  Minimal: No identifiable suicidal ideation.  Patients presenting with no risk factors but with morbid ruminations; may be classified as minimal risk based on the severity of the depressive symptoms   Follow-up Information     Pc, [phone] Follow up.   Why: Walk in hours are from 9AM to 4PM on Monday though Friday.  Thanks! Contact information: [address] [place] [address]                 Plan Of Care/Follow-up recommendations:  Activity:  as tolerated Diet:  low sodium heart healthy diet  [phone], MD 03/15/2021, 9:32 AM
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
## 2021-03-15 NOTE — BHH Group Notes (Signed)
BHH Group Notes:  (Nursing/MHT/Case Management/Adjunct)  Date:  03/15/2021  Time:  9:27 AM  Type of Therapy:  Group Therapy  Participation Level:  Active  Participation Quality:  Appropriate and Attentive  Affect:  Appropriate  Cognitive:  Alert and Appropriate  Insight:  Appropriate  Engagement in Group:  Engaged  Modes of Intervention:  Support  Summary of Progress/Problems: Staff engaged in morning group therapy. Introducing self to patient and asking if the patient had a goal for the day and how they are feeling now since their day of admission. Patient actively engaged in group where he stated that his goal was to talk with a case worker because he does not feel that he will get the appropriate treatment being at a rehab that is in the area in which he lives. Patient explains that he is wanting to look and move forward and getting into the RIGHT program is important. Patient admits that he feel a lot better now than he did when he was first admitted.   Sharee Pimple 03/15/2021, 9:27 AM

## 2021-03-15 NOTE — BHH Counselor (Signed)
CSW met with the patient.  Patient declined [facility] at this time stating that he did not feel that being in [place] would be effective for his treatment, "I know too many people." Patient requested referral for outside of [facility].  CSW explained that patient has been referred to [facility], [facility], all facilities have declined patient at this time with the exception of [facility].  Psychiatrist has indicated that patient is to be discharged.  Assunta Curtis, MSW, LCSW 03/15/2021 9:25 AM

## 2021-09-14 ENCOUNTER — Emergency Department: Payer: Medicare Other

## 2021-09-14 ENCOUNTER — Emergency Department
Admission: EM | Admit: 2021-09-14 | Discharge: 2021-09-14 | Discharge status: Against Medical Advice | Payer: Medicare Other | Attending: Emergency Medicine | Admitting: Emergency Medicine

## 2021-09-14 DIAGNOSIS — M542 Cervicalgia: Secondary | ICD-10-CM | POA: Insufficient documentation

## 2021-09-14 DIAGNOSIS — S0990XA Unspecified injury of head, initial encounter: Secondary | ICD-10-CM | POA: Diagnosis present

## 2021-09-14 DIAGNOSIS — Z5329 Procedure and treatment not carried out because of patient's decision for other reasons: Secondary | ICD-10-CM | POA: Diagnosis not present

## 2021-09-14 DIAGNOSIS — S060X1A Concussion with loss of consciousness of 30 minutes or less, initial encounter: Secondary | ICD-10-CM | POA: Diagnosis not present

## 2021-09-14 NOTE — ED Notes (Signed)
Pt. Left AMA, refused to sign. Witnessed by this RN, and Amil Amen, RN ?
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
## 2021-09-14 NOTE — ED Notes (Signed)
Family member and PD to bedside. ?

## 2021-09-14 NOTE — ED Notes (Signed)
Pt. Returned from CT.

## 2021-09-15 ENCOUNTER — Emergency Department (HOSPITAL_COMMUNITY)
Admission: EM | Admit: 2021-09-15 | Discharge: 2021-09-15 | Disposition: A | Discharge status: Home / Self Care | Payer: Medicare Other | Attending: Emergency Medicine | Admitting: Emergency Medicine

## 2021-09-15 ENCOUNTER — Other Ambulatory Visit: Payer: Self-pay

## 2021-09-15 DIAGNOSIS — Z59811 Housing instability, housed, with risk of homelessness: Secondary | ICD-10-CM | POA: Diagnosis not present

## 2021-09-15 DIAGNOSIS — F1410 Cocaine abuse, uncomplicated: Secondary | ICD-10-CM

## 2021-09-15 DIAGNOSIS — Z79899 Other long term (current) drug therapy: Secondary | ICD-10-CM | POA: Diagnosis not present

## 2021-09-15 DIAGNOSIS — I10 Essential (primary) hypertension: Secondary | ICD-10-CM

## 2021-09-15 DIAGNOSIS — F1490 Cocaine use, unspecified, uncomplicated: Secondary | ICD-10-CM | POA: Insufficient documentation

## 2021-09-15 DIAGNOSIS — Z59819 Housing instability, housed unspecified: Secondary | ICD-10-CM

## 2021-09-15 NOTE — Discharge Instructions (Addendum)
It was our pleasure to provide your ER care today - we hope that you feel better. ? ?'Inpatient detox' is not offered for cocaine use disorder - please see resource guide provided in terms of pursuing outpatient treatment/outpatient program for substance use issues.   Please also see additional information regarding shelters and other social services in the area.  ? ?Also follow up closely with primary care doctor in the next 1-2 weeks regarindg your blood pressure which is mildly high today. ? ?For mental health issues and/or crisis you may also go to the [facility].  ? ?Return to ER if worse, new symptoms, high fevers, chest pain, increased trouble breathing, or other concern.  ?
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
## 2021-09-15 NOTE — ED Triage Notes (Signed)
Pt arrives POV with complaints of detox from cocaine, alcohol, and heroin. Pt states he used cocaine last night, and last drink was two days ago. Pt arrives AOx4 and denies pain ?

## 2022-01-30 ENCOUNTER — Emergency Department
Admission: EM | Admit: 2022-01-30 | Discharge: 2022-01-30 | Disposition: A | Discharge status: Home / Self Care | Payer: Medicare Other | Source: Home / Self Care | Attending: Emergency Medicine | Admitting: Emergency Medicine

## 2022-01-30 ENCOUNTER — Encounter: Payer: Self-pay | Admitting: Emergency Medicine

## 2022-01-30 ENCOUNTER — Other Ambulatory Visit: Payer: Self-pay

## 2022-01-30 DIAGNOSIS — K047 Periapical abscess without sinus: Secondary | ICD-10-CM

## 2022-01-30 DIAGNOSIS — A419 Sepsis, unspecified organism: Secondary | ICD-10-CM | POA: Diagnosis not present

## 2022-01-30 MED ORDER — NAPROXEN 500 MG PO TABS: 500.0000 mg | ORAL_TABLET | Freq: Two times a day (BID) | ORAL | 0 refills | Status: DC

## 2022-01-30 MED ORDER — LIDOCAINE VISCOUS HCL 2 % MT SOLN
15.0000 mL | Freq: Once | OROMUCOSAL | Status: AC
  Administered 2022-01-30: 15 mL via OROMUCOSAL
  Filled 2022-01-30: qty 15

## 2022-01-30 MED ORDER — AMOXICILLIN 875 MG PO TABS: 875.0000 mg | ORAL_TABLET | Freq: Two times a day (BID) | ORAL | 0 refills | Status: DC

## 2022-01-30 NOTE — ED Triage Notes (Signed)
Pt presents to ED POV with c/o of upper dental pain. Pt states ongoing for 2 weeks. Pt denies fevers.

## 2022-01-30 NOTE — ED Provider Notes (Signed)
[facility] Provider Note    Event Date/Time   First MD Initiated Contact with Patient 01/30/22 [phone]     (approximate)   History   Dental Pain   HPI  Troy Wiley is a 51 y.o. male   presents to the ED with complaint of upper dental pain on the left for approximately 2 weeks.  Patient states he does not have a dentist and cannot find anyone to take his Medicaid.  He denies any fever and has been taking some over-the-counter medication.  Patient has a history of chronic pain, hypertension, chronic posttraumatic headaches and cocaine abuse.      Physical Exam   Triage Vital Signs: ED Triage Vitals  Enc Vitals Group     BP 01/30/22 0810 (!) 157/97     Pulse Rate 01/30/22 0809 83     Resp 01/30/22 0809 18     Temp 01/30/22 0809 98.4 F (36.9 C)     Temp Source 01/30/22 0809 Oral     SpO2 01/30/22 0809 96 %     Weight 01/30/22 0857 184 lb 15.5 oz (83.9 kg)     Height 01/30/22 0857 5\' 9"  (1.753 m)     Head Circumference --      Peak Flow --      Pain Score 01/30/22 0809 10     Pain Loc --      Pain Edu? --      Excl. in GC? --     Most recent vital signs: Vitals:   01/30/22 0809 01/30/22 0810  BP:  (!) 157/97  Pulse: 83   Resp: 18   Temp: 98.4 F (36.9 C)   SpO2: 96%      General: Awake, no distress.  CV:  Good peripheral perfusion.  Resp:  Normal effort.  Abd:  No distention.  Other:  Left upper posterior molar and gum in poor repair.  No obvious abscess present.  No facial swelling noted.   ED Results / Procedures / Treatments   Labs (all labs ordered are listed, but only abnormal results are displayed) Labs Reviewed - No data to display     PROCEDURES:  Critical Care performed:   Procedures   MEDICATIONS ORDERED IN ED: Medications  lidocaine (XYLOCAINE) 2 % viscous mouth solution 15 mL (15 mLs Mouth/Throat Given 01/30/22 1002)     IMPRESSION / MDM / ASSESSMENT AND PLAN / ED COURSE  I reviewed the triage  vital signs and the nursing notes.   Differential diagnosis includes, but is not limited to, dental pain, gingivitis, dental abscess.  51 year old male presents to the ED with complaint of dental pain for the last 2 weeks.  Patient is having difficulty finding a dentist who will take his Medicaid.  While in the ED he was given viscous lidocaine to apply directly to his tooth.  A prescription for naproxen was sent to the pharmacy along with amoxicillin 875 twice daily for 10 days.  He was given a list of dental clinics in the area and encouraged to call each of these to see if they are taking Medicaid patients.      Patient's presentation is most consistent with acute, uncomplicated illness.  FINAL CLINICAL IMPRESSION(S) / ED DIAGNOSES   Final diagnoses:  Dental abscess     Rx / DC Orders   ED Discharge Orders          Ordered    amoxicillin (AMOXIL) 875 MG tablet  2 times  daily        01/30/22 0954    naproxen (NAPROSYN) 500 MG tablet  2 times daily with meals        01/30/22 0955             Note:  This document was prepared using Dragon voice recognition software and may include unintentional dictation errors.   Tommi Rumps, PA-C 01/30/22 1458    Minna Antis, MD 01/30/22 1517

## 2022-01-30 NOTE — Discharge Instructions (Signed)
Begin taking the antibiotic as directed until you are completely finished.  Also you will need to call the clinics listed on your discharge papers to see if they are taking Medicaid patients.  OPTIONS FOR DENTAL FOLLOW UP CARE  [place] [facility] - Local Safety Net Dental Clinics [email or web address]   [facility] [phone])  [facility] [phone])  [place] [phone])  [facility] [phone])  [facility] [phone]) This clinic caters to the indigent population and is on a lottery system. Location: [employer] [facility], [employer], [address] [place] Clinic Hours: Wednesdays from 6pm - 9pm, patients seen by a lottery system. For dates, call or go to [email or web address] Services: Cleanings, fillings and simple extractions. Payment Options: DENTAL WORK IS FREE OF CHARGE. Bring proof of income or support. Best way to get seen: Arrive at 5:15 pm - this is a lottery, NOT first come/first serve, so arriving earlier will not increase your chances of being seen.     [facility] [phone] Select option 1 for emergencies   Location: [facility], [place], [address], [place] Clinic Hours: No walk-ins accepted - call the day before to schedule an appointment. Check in times are 9:30 am and 1:30 pm. Services: Simple extractions, temporary fillings, pulpectomy/pulp debridement, uncomplicated abscess drainage. Payment Options: PAYMENT IS DUE AT THE TIME OF SERVICE.  Fee is usually $100-200, additional surgical procedures (e.g. abscess drainage) may be extra. Cash, checks, Visa/MasterCard accepted.  Can file Medicaid if patient is covered for dental - patient should call case worker to check. No discount for [facility]  patients. Best way to get seen: MUST call the day before and get onto the schedule. Can usually be seen the next 1-2 days. No walk-ins accepted.     [facility] [phone]   Location: [facility], [address] [place] Clinic Hours: M, W, Th, F 8am or 1:30pm, Tues 9a or 1:30 - first come/first served. Services: Simple extractions, temporary fillings, uncomplicated abscess drainage.  You do not need to be an [facility] resident. Payment Options: PAYMENT IS DUE AT THE TIME OF SERVICE. Dental insurance, otherwise sliding scale - bring proof of income or support. Depending on income and treatment needed, cost is usually $50-200. Best way to get seen: Arrive early as it is first come/first served.     [facility] [phone]   Location: [address] Clinic Hours: Mon-Thu 8a-5p Services: Most basic dental services including extractions and fillings. Payment Options: PAYMENT IS DUE AT THE TIME OF SERVICE. Sliding scale, up to 50% off - bring proof if income or support. Medicaid with dental option accepted. Best way to get seen: Call to schedule an appointment, can usually be seen within 2 weeks OR they will try to see walk-ins - show up at 8a or 2p (you may have to wait).     [facility] [phone] [place] RESIDENTS ONLY   Location: [facility], [address], [place], [place] [address] Clinic Hours: By appointment only. Monday - Thursday 8am-5pm, Friday 8am-12pm Services: Cleanings, fillings, extractions. Payment Options: PAYMENT IS DUE AT THE TIME OF SERVICE. Cash, Visa or MasterCard. Sliding scale - $30 minimum per service. Best way to get seen: Come in to office, complete packet and make an appointment - need proof of income or support monies  for each household member and proof of [facility] residence. Usually takes about a month to  get in.     [facility] [phone]   Location: [address] [facility] Clinic Hours: Walk-in Urgent Care Dental Services are offered Monday-Friday mornings only. The numbers of emergencies accepted daily is limited to the number of providers available. Maximum 15 - Mondays, Wednesdays & Thursdays Maximum 10 - Tuesdays & Fridays Services: You do not need to be a [facility] resident to be seen for a dental emergency. Emergencies are defined as pain, swelling, abnormal bleeding, or dental trauma. Walkins will receive x-rays if needed. NOTE: Dental cleaning is not an emergency. Payment Options: PAYMENT IS DUE AT THE TIME OF SERVICE. Minimum co-pay is $40.00 for uninsured patients. Minimum co-pay is $3.00 for Medicaid with dental coverage. Dental Insurance is accepted and must be presented at time of visit. Medicare does not cover dental. Forms of payment: Cash, credit card, checks. Best way to get seen: If not previously registered with the clinic, walk-in dental registration begins at 7:15 am and is on a first come/first serve basis. If previously registered with the clinic, call to make an appointment.     [facility] [phone] [place] RESIDENTS ONLY   Location: [address] [place], [place] Clinic Hours: Mon-Thu 10a-2p Services: Extractions only! Payment Options: FREE (donations accepted) - bring proof of income or support Best way to get seen: Call and schedule an appointment OR come at 8am on the 1st Monday of every month (except for holidays) when it is first come/first served.     [facility] [phone]   Location: [address] [place], [place] Clinic Hours: Friday mornings Services, Payment Options, Best way to get seen: Call for info

## 2022-01-31 ENCOUNTER — Encounter: Payer: Self-pay | Admitting: Emergency Medicine

## 2022-01-31 ENCOUNTER — Inpatient Hospital Stay
Admission: EM | Admit: 2022-01-31 | Discharge: 2022-02-02 | DRG: 603 | Disposition: A | Discharge status: Home / Self Care | Payer: Medicare Other | Attending: Internal Medicine | Admitting: Internal Medicine

## 2022-01-31 ENCOUNTER — Emergency Department: Payer: Medicare Other

## 2022-01-31 DIAGNOSIS — L0291 Cutaneous abscess, unspecified: Secondary | ICD-10-CM | POA: Diagnosis not present

## 2022-01-31 DIAGNOSIS — Z720 Tobacco use: Secondary | ICD-10-CM | POA: Diagnosis present

## 2022-01-31 DIAGNOSIS — L03211 Cellulitis of face: Secondary | ICD-10-CM | POA: Diagnosis not present

## 2022-01-31 DIAGNOSIS — I10 Essential (primary) hypertension: Secondary | ICD-10-CM | POA: Diagnosis present

## 2022-01-31 DIAGNOSIS — G8929 Other chronic pain: Secondary | ICD-10-CM | POA: Diagnosis present

## 2022-01-31 DIAGNOSIS — F17210 Nicotine dependence, cigarettes, uncomplicated: Secondary | ICD-10-CM | POA: Diagnosis present

## 2022-01-31 DIAGNOSIS — R220 Localized swelling, mass and lump, head: Secondary | ICD-10-CM

## 2022-01-31 DIAGNOSIS — K047 Periapical abscess without sinus: Secondary | ICD-10-CM

## 2022-01-31 DIAGNOSIS — F1410 Cocaine abuse, uncomplicated: Secondary | ICD-10-CM | POA: Diagnosis present

## 2022-01-31 DIAGNOSIS — Z79899 Other long term (current) drug therapy: Secondary | ICD-10-CM

## 2022-01-31 DIAGNOSIS — Z8249 Family history of ischemic heart disease and other diseases of the circulatory system: Secondary | ICD-10-CM

## 2022-01-31 DIAGNOSIS — G44329 Chronic post-traumatic headache, not intractable: Secondary | ICD-10-CM | POA: Diagnosis present

## 2022-01-31 DIAGNOSIS — F32A Depression, unspecified: Secondary | ICD-10-CM | POA: Diagnosis present

## 2022-01-31 DIAGNOSIS — A419 Sepsis, unspecified organism: Principal | ICD-10-CM | POA: Diagnosis present

## 2022-01-31 DIAGNOSIS — L0390 Cellulitis, unspecified: Principal | ICD-10-CM

## 2022-01-31 LAB — PROTIME-INR
INR: 1.1 (ref 0.8–1.2)
Prothrombin Time: 14.3 seconds (ref 11.4–15.2)

## 2022-01-31 LAB — CBC WITH DIFFERENTIAL/PLATELET
Abs Immature Granulocytes: 0.07 10*3/uL (ref 0.00–0.07)
Basophils Absolute: 0.1 10*3/uL (ref 0.0–0.1)
Basophils Relative: 0 %
Eosinophils Absolute: 0.1 10*3/uL (ref 0.0–0.5)
Eosinophils Relative: 1 %
HCT: 41.9 % (ref 39.0–52.0)
Hemoglobin: 14 g/dL (ref 13.0–17.0)
Immature Granulocytes: 1 %
Lymphocytes Relative: 16 %
Lymphs Abs: 2.5 10*3/uL (ref 0.7–4.0)
MCH: 31.1 pg (ref 26.0–34.0)
MCHC: 33.4 g/dL (ref 30.0–36.0)
MCV: 93.1 fL (ref 80.0–100.0)
Monocytes Absolute: 1.6 10*3/uL — ABNORMAL HIGH (ref 0.1–1.0)
Monocytes Relative: 10 %
Neutro Abs: 10.9 10*3/uL — ABNORMAL HIGH (ref 1.7–7.7)
Neutrophils Relative %: 72 %
Platelets: 308 10*3/uL (ref 150–400)
RBC: 4.5 MIL/uL (ref 4.22–5.81)
RDW: 12.6 % (ref 11.5–15.5)
WBC: 15.2 10*3/uL — ABNORMAL HIGH (ref 4.0–10.5)
nRBC: 0 % (ref 0.0–0.2)

## 2022-01-31 LAB — BASIC METABOLIC PANEL
Anion gap: 7 (ref 5–15)
BUN: 11 mg/dL (ref 6–20)
CO2: 23 mmol/L (ref 22–32)
Calcium: 8.8 mg/dL — ABNORMAL LOW (ref 8.9–10.3)
Chloride: 110 mmol/L (ref 98–111)
Creatinine, Ser: 1.07 mg/dL (ref 0.61–1.24)
GFR, Estimated: >60 mL/min (ref >60)
Glucose, Bld: 119 mg/dL — ABNORMAL HIGH (ref 70–99)
Potassium: 3.6 mmol/L (ref 3.5–5.1)
Sodium: 140 mmol/L (ref 135–145)

## 2022-01-31 LAB — HIV ANTIBODY (ROUTINE TESTING W REFLEX): HIV Screen 4th Generation wRfx: NONREACTIVE

## 2022-01-31 LAB — PROCALCITONIN: Procalcitonin: <0.1 ng/mL

## 2022-01-31 LAB — LACTIC ACID, PLASMA: Lactic Acid, Venous: 1.2 mmol/L (ref 0.5–1.9)

## 2022-01-31 LAB — SEDIMENTATION RATE: Sed Rate: 12 mm/hr (ref 0–20)

## 2022-01-31 LAB — C-REACTIVE PROTEIN: CRP: 4.2 mg/dL — ABNORMAL HIGH (ref <1.0)

## 2022-01-31 MED ORDER — SODIUM CHLORIDE 0.9 % IV BOLUS
1000.0000 mL | Freq: Once | INTRAVENOUS | Status: AC
  Administered 2022-01-31: 1000 mL via INTRAVENOUS

## 2022-01-31 MED ORDER — ACETAMINOPHEN 325 MG PO TABS
650.0000 mg | ORAL_TABLET | Freq: Four times a day (QID) | ORAL | Status: DC | PRN
  Administered 2022-01-31 – 2022-02-01 (×4): 650 mg via ORAL
  Filled 2022-01-31 (×4): qty 2

## 2022-01-31 MED ORDER — DEXAMETHASONE SODIUM PHOSPHATE 10 MG/ML IJ SOLN
10.0000 mg | Freq: Once | INTRAMUSCULAR | Status: AC
  Administered 2022-01-31: 10 mg via INTRAVENOUS
  Filled 2022-01-31: qty 1

## 2022-01-31 MED ORDER — NICOTINE 21 MG/24HR TD PT24
21.0000 mg | MEDICATED_PATCH | Freq: Every day | TRANSDERMAL | Status: DC
  Administered 2022-01-31 – 2022-02-02 (×3): 21 mg via TRANSDERMAL
  Filled 2022-01-31 (×3): qty 1

## 2022-01-31 MED ORDER — LIDOCAINE VISCOUS HCL 2 % MT SOLN
15.0000 mL | Freq: Once | OROMUCOSAL | Status: AC
  Administered 2022-01-31: 15 mL via OROMUCOSAL
  Filled 2022-01-31: qty 15

## 2022-01-31 MED ORDER — DEXAMETHASONE SODIUM PHOSPHATE 10 MG/ML IJ SOLN
10.0000 mg | Freq: Two times a day (BID) | INTRAMUSCULAR | Status: DC
Start: 2022-01-31 — End: 2022-02-01
  Administered 2022-01-31: 10 mg via INTRAVENOUS
  Filled 2022-01-31: qty 1

## 2022-01-31 MED ORDER — HYDRALAZINE HCL 20 MG/ML IJ SOLN
5.0000 mg | INTRAMUSCULAR | Status: DC | PRN
Start: 2022-01-31 — End: 2022-02-02

## 2022-01-31 MED ORDER — IOHEXOL 300 MG/ML  SOLN
75.0000 mL | Freq: Once | INTRAMUSCULAR | Status: AC | PRN
  Administered 2022-01-31: 75 mL via INTRAVENOUS

## 2022-01-31 MED ORDER — HEPARIN SODIUM (PORCINE) 5000 UNIT/ML IJ SOLN
5000.0000 [IU] | Freq: Three times a day (TID) | INTRAMUSCULAR | Status: DC
  Administered 2022-01-31 – 2022-02-02 (×7): 5000 [IU] via SUBCUTANEOUS
  Filled 2022-01-31 (×7): qty 1

## 2022-01-31 MED ORDER — KETOROLAC TROMETHAMINE 30 MG/ML IJ SOLN: 15.0000 mg | Freq: Four times a day (QID) | INTRAMUSCULAR | Status: DC | PRN

## 2022-01-31 MED ORDER — ONDANSETRON HCL 4 MG/2ML IJ SOLN
4.0000 mg | Freq: Once | INTRAMUSCULAR | Status: AC
  Administered 2022-01-31: 4 mg via INTRAVENOUS
  Filled 2022-01-31: qty 2

## 2022-01-31 MED ORDER — ONDANSETRON HCL 4 MG/2ML IJ SOLN: 4.0000 mg | Freq: Three times a day (TID) | INTRAMUSCULAR | Status: DC | PRN

## 2022-01-31 MED ORDER — AMPICILLIN-SULBACTAM SODIUM 3 (2-1) G IJ SOLR
3.0000 g | Freq: Once | INTRAMUSCULAR | Status: AC
  Administered 2022-01-31: 3 g via INTRAVENOUS
  Filled 2022-01-31: qty 8

## 2022-01-31 MED ORDER — AMLODIPINE BESYLATE 5 MG PO TABS
5.0000 mg | ORAL_TABLET | Freq: Every day | ORAL | Status: DC
  Administered 2022-01-31 – 2022-02-02 (×3): 5 mg via ORAL
  Filled 2022-01-31 (×3): qty 1

## 2022-01-31 MED ORDER — ACETAMINOPHEN 500 MG PO TABS
1000.0000 mg | ORAL_TABLET | Freq: Once | ORAL | Status: AC
Start: 2022-01-31 — End: 2022-01-31
  Administered 2022-01-31: 1000 mg via ORAL
  Filled 2022-01-31: qty 2

## 2022-01-31 MED ORDER — IBUPROFEN 400 MG PO TABS
400.0000 mg | ORAL_TABLET | Freq: Three times a day (TID) | ORAL | Status: DC
  Administered 2022-01-31 – 2022-02-01 (×4): 400 mg via ORAL
  Filled 2022-01-31 (×4): qty 1

## 2022-01-31 MED ORDER — SODIUM CHLORIDE 0.9 % IV SOLN
3.0000 g | Freq: Three times a day (TID) | INTRAVENOUS | Status: DC
  Administered 2022-01-31 – 2022-02-01 (×3): 3 g via INTRAVENOUS
  Filled 2022-01-31 (×4): qty 8

## 2022-01-31 MED ORDER — KETOROLAC TROMETHAMINE 30 MG/ML IJ SOLN
30.0000 mg | Freq: Once | INTRAMUSCULAR | Status: AC
  Administered 2022-01-31: 30 mg via INTRAVENOUS
  Filled 2022-01-31: qty 1

## 2022-01-31 MED ORDER — CHLORHEXIDINE GLUCONATE 0.12 % MT SOLN
15.0000 mL | Freq: Once | OROMUCOSAL | Status: AC
  Administered 2022-01-31: 15 mL via OROMUCOSAL
  Filled 2022-01-31: qty 15

## 2022-01-31 MED ORDER — MORPHINE SULFATE (PF) 4 MG/ML IV SOLN
4.0000 mg | Freq: Once | INTRAVENOUS | Status: AC
  Administered 2022-01-31: 4 mg via INTRAVENOUS
  Filled 2022-01-31: qty 1

## 2022-01-31 NOTE — Assessment & Plan Note (Addendum)
Sepsis due to facial cellulitis (odontogenic cellulitis), and subperiosteal abscess: per EDP, pt has a tiny periosteal abscess at tooth #14 that I was able to express pus from with just palpation. Pt meets criteria for sepsis with WBC 15.2 and heart rate 96. lactic acid 1.2.  - Placed on MedSurg bed for observation - Empiric antimicrobial treatment with unasyn - PRN Zofran for nausea - Pain control: Ibuprofen, Tylenol, ketorolac - Blood cultures x 2  - ESR and CRP - will get Procalcitonin - IVF: 1.0 L of NS bolus - Decadron 10 mg twice daily

## 2022-01-31 NOTE — TOC Initial Note (Signed)
Transition of Care [facility]) - Initial/Assessment Note    Patient Details  Name: Troy Wiley MRN: [id number] Date of Birth: Nov 02, 1970  Transition of [facility]) CM/SW Contact:    Chapman Fitch, RN Phone Number: 01/31/2022, 2:07 PM  Clinical Narrative:                       Transition of Care (TOC) Screening Note   Patient Details  Name: Troy Wiley Date of Birth: [birth year]   Transition of Care [facility]) CM/SW Contact:    Chapman Fitch, RN Phone Number: 01/31/2022, 2:07 PM    Transition of Care Department [facility]) has reviewed patient and no TOC needs have been identified at this time. We will continue to monitor patient advancement through interdisciplinary progression rounds. If new patient transition needs arise, please place a TOC consult.     Patient Goals and CMS Choice        Expected Discharge Plan and Services                                                Prior Living Arrangements/Services                       Activities of Daily Living Home Assistive Devices/Equipment: None ADL Screening (condition at time of admission) Patient's cognitive ability adequate to safely complete daily activities?: Yes Is the patient deaf or have difficulty hearing?: No Does the patient have difficulty seeing, even when wearing glasses/contacts?: No Does the patient have difficulty concentrating, remembering, or making decisions?: No Patient able to express need for assistance with ADLs?: Yes Does the patient have difficulty dressing or bathing?: No Independently performs ADLs?: Yes (appropriate for developmental age) Does the patient have difficulty walking or climbing stairs?: No Weakness of Legs: None Weakness of Arms/Hands: None  Permission Sought/Granted                  Emotional Assessment              Admission diagnosis:  Dental abscess [K04.7] Left facial swelling [R22.0] Facial cellulitis [L03.211] Cellulitis,  unspecified cellulitis site [L03.90] Patient Active Problem List   Diagnosis Date Noted   Facial cellulitis 01/31/2022   Hypertension    Sepsis (HCC)    Abscess_ periosteal abscess     Depression    Tobacco abuse    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 ([address]), [place] - [address] [place] ([place]) [place] [address] Phone: [phone] Fax: [phone]  [facility] [address], [place] - [address] [place] [address] Phone: [phone] Fax: [phone]     Social Determinants of Health (SDOH) Interventions    Readmission Risk Interventions     No data to display

## 2022-01-31 NOTE — ED Notes (Signed)
Pt just returned from CT. Patient resting in bed free from sign of distress. Breathing unlabored speaking in full sentences with symmetric chest rise and fall. Bed low and locked with side rails raised x2. Call bell in reach and monitor in place.
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
## 2022-01-31 NOTE — Assessment & Plan Note (Signed)
-  Nicotine patch 

## 2022-01-31 NOTE — Assessment & Plan Note (Signed)
-  see above 

## 2022-01-31 NOTE — Care Management Obs Status (Signed)
MEDICARE OBSERVATION STATUS NOTIFICATION   Patient Details  Name: Troy Wiley MRN: [id number] Date of Birth: 05/29/1970   Medicare Observation Status Notification Given:  Yes    Chapman Fitch, RN 01/31/2022, 2:05 PM

## 2022-01-31 NOTE — Plan of Care (Signed)
  Problem: Education: Goal: Knowledge of General Education information will improve Description: Including pain rating scale, medication(s)/side effects and non-pharmacologic comfort measures Outcome: Progressing   Problem: Health Behavior/Discharge Planning: Goal: Ability to manage health-related needs will improve Outcome: Progressing   Problem: Clinical Measurements: Goal: Respiratory complications will improve Outcome: Progressing   Problem: Activity: Goal: Risk for activity intolerance will decrease Outcome: Progressing   Problem: Nutrition: Goal: Adequate nutrition will be maintained Outcome: Progressing   Problem: Pain Managment: Goal: General experience of comfort will improve Outcome: Progressing   Problem: Safety: Goal: Ability to remain free from injury will improve Outcome: Progressing   Problem: Skin Integrity: Goal: Risk for impaired skin integrity will decrease Outcome: Progressing   

## 2022-01-31 NOTE — ED Notes (Signed)
Pt is calm and cooperative, given TV remote along with meds, ambulated to toilet,

## 2022-01-31 NOTE — Assessment & Plan Note (Signed)
-  Did counseling about importance of quitting substance use -Check UDS

## 2022-01-31 NOTE — ED Triage Notes (Signed)
Patient ambulatory to triage with steady gait, without difficulty or distress noted; rx amoxi yesterday for dental abscess; returns for increasing swelling & pain

## 2022-01-31 NOTE — Assessment & Plan Note (Signed)
Patient is not taking medications currently.  No suicidal or homicidal ideations. -Observe closely

## 2022-01-31 NOTE — Assessment & Plan Note (Signed)
Patient is not taking medications currently.  Blood pressure 138/90. -Restart home amlodipine 5 mg daily -IV hydralazine as needed

## 2022-01-31 NOTE — Consult Note (Signed)
Pharmacy Antibiotic Note  Troy Wiley is a 52 y.o. male admitted on 01/31/2022 with cellulitis.  Pharmacy has been consulted for unasyn dosing.  Plan: Unasyn 3g IV every 8 hours  Height: 5\' 9"  (175.3 cm) Weight: 97.5 kg (215 lb) IBW/kg (Calculated) : 70.7  Temp (24hrs), Avg:99 F (37.2 C), Min:98.4 F (36.9 C), Max:99.6 F (37.6 C)  Recent Labs  Lab 01/31/22 0542  WBC 15.2*  CREATININE 1.07  LATICACIDVEN 1.2    Estimated Creatinine Clearance: 95.1 mL/min (by C-G formula based on SCr of 1.07 mg/dL).    No Known Allergies  Antimicrobials this admission: 9/8 Unasyn >>    Microbiology results: 9/8 BCx: sent  Thank you for allowing pharmacy to be a part of this patient's care.  11/8 01/31/2022 7:47 AM

## 2022-01-31 NOTE — Progress Notes (Signed)
Pt arrived to room 203 from the ED. Received report from [place], Charity fundraiser. See assessment. Will continue to monitor.
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
## 2022-02-01 DIAGNOSIS — G44329 Chronic post-traumatic headache, not intractable: Secondary | ICD-10-CM | POA: Diagnosis present

## 2022-02-01 DIAGNOSIS — K047 Periapical abscess without sinus: Secondary | ICD-10-CM | POA: Diagnosis present

## 2022-02-01 DIAGNOSIS — F1410 Cocaine abuse, uncomplicated: Secondary | ICD-10-CM | POA: Diagnosis present

## 2022-02-01 DIAGNOSIS — G8929 Other chronic pain: Secondary | ICD-10-CM | POA: Diagnosis present

## 2022-02-01 DIAGNOSIS — L03211 Cellulitis of face: Secondary | ICD-10-CM | POA: Diagnosis present

## 2022-02-01 DIAGNOSIS — F32A Depression, unspecified: Secondary | ICD-10-CM | POA: Diagnosis present

## 2022-02-01 DIAGNOSIS — Z8249 Family history of ischemic heart disease and other diseases of the circulatory system: Secondary | ICD-10-CM | POA: Diagnosis not present

## 2022-02-01 DIAGNOSIS — A419 Sepsis, unspecified organism: Secondary | ICD-10-CM | POA: Diagnosis present

## 2022-02-01 DIAGNOSIS — I10 Essential (primary) hypertension: Secondary | ICD-10-CM | POA: Diagnosis present

## 2022-02-01 DIAGNOSIS — Z79899 Other long term (current) drug therapy: Secondary | ICD-10-CM | POA: Diagnosis not present

## 2022-02-01 DIAGNOSIS — F17210 Nicotine dependence, cigarettes, uncomplicated: Secondary | ICD-10-CM | POA: Diagnosis present

## 2022-02-01 LAB — CBC
HCT: 39 % (ref 39.0–52.0)
Hemoglobin: 13 g/dL (ref 13.0–17.0)
MCH: 30.4 pg (ref 26.0–34.0)
MCHC: 33.3 g/dL (ref 30.0–36.0)
MCV: 91.3 fL (ref 80.0–100.0)
Platelets: 336 10*3/uL (ref 150–400)
RBC: 4.27 MIL/uL (ref 4.22–5.81)
RDW: 12.5 % (ref 11.5–15.5)
WBC: 26.2 10*3/uL — ABNORMAL HIGH (ref 4.0–10.5)
nRBC: 0 % (ref 0.0–0.2)

## 2022-02-01 MED ORDER — POLYETHYLENE GLYCOL 3350 17 G PO PACK
17.0000 g | PACK | Freq: Every day | ORAL | Status: DC
  Administered 2022-02-01: 17 g via ORAL
  Filled 2022-02-01 (×2): qty 1

## 2022-02-01 MED ORDER — AMPICILLIN-SULBACTAM SODIUM 3 (2-1) G IJ SOLR
3.0000 g | Freq: Four times a day (QID) | INTRAMUSCULAR | Status: DC
  Administered 2022-02-01 – 2022-02-02 (×3): 3 g via INTRAVENOUS
  Filled 2022-02-01 (×5): qty 8

## 2022-02-01 MED ORDER — SENNOSIDES-DOCUSATE SODIUM 8.6-50 MG PO TABS
1.0000 | ORAL_TABLET | Freq: Two times a day (BID) | ORAL | Status: DC
  Administered 2022-02-01 – 2022-02-02 (×3): 1 via ORAL
  Filled 2022-02-01 (×3): qty 1

## 2022-02-01 MED ORDER — DEXAMETHASONE SODIUM PHOSPHATE 10 MG/ML IJ SOLN
4.0000 mg | Freq: Two times a day (BID) | INTRAMUSCULAR | Status: DC
  Administered 2022-02-01 – 2022-02-02 (×3): 4 mg via INTRAVENOUS
  Filled 2022-02-01 (×3): qty 1

## 2022-02-01 MED ORDER — KETOROLAC TROMETHAMINE 30 MG/ML IJ SOLN
15.0000 mg | Freq: Four times a day (QID) | INTRAMUSCULAR | Status: DC
  Administered 2022-02-01 – 2022-02-02 (×4): 15 mg via INTRAVENOUS
  Filled 2022-02-01 (×4): qty 1
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
## 2022-02-01 NOTE — Consult Note (Signed)
Pharmacy Antibiotic Note  Troy Wiley is a 51 y.o. male admitted on 01/31/2022 with cellulitis.  Pharmacy has been consulted for unasyn dosing.  Plan: Adjust Unasyn to 3g IV every 6 hours  Height: 5\' 9"  (175.3 cm) Weight: 98.6 kg (217 lb 6 oz) IBW/kg (Calculated) : 70.7  Temp (24hrs), Avg:97.9 F (36.6 C), Min:97.7 F (36.5 C), Max:98.3 F (36.8 C)  Recent Labs  Lab 01/31/22 0542 02/01/22 0606  WBC 15.2* 26.2*  CREATININE 1.07  --   LATICACIDVEN 1.2  --      Estimated Creatinine Clearance: 95.7 mL/min (by C-G formula based on SCr of 1.07 mg/dL).    No Known Allergies  Antimicrobials this admission: 9/8 Unasyn >>    Microbiology results: 9/8 BCx: sent  Thank you for allowing pharmacy to be a part of this patient's care.  11/8, PharmD, BCPS Clinical Pharmacist   02/01/2022 11:19 AM

## 2022-02-02 DIAGNOSIS — L03211 Cellulitis of face: Secondary | ICD-10-CM | POA: Diagnosis not present

## 2022-02-02 LAB — BASIC METABOLIC PANEL
Anion gap: 6 (ref 5–15)
BUN: 21 mg/dL — ABNORMAL HIGH (ref 6–20)
CO2: 23 mmol/L (ref 22–32)
Calcium: 8.5 mg/dL — ABNORMAL LOW (ref 8.9–10.3)
Chloride: 111 mmol/L (ref 98–111)
Creatinine, Ser: 0.99 mg/dL (ref 0.61–1.24)
GFR, Estimated: >60 mL/min (ref >60)
Glucose, Bld: 152 mg/dL — ABNORMAL HIGH (ref 70–99)
Potassium: 4 mmol/L (ref 3.5–5.1)
Sodium: 140 mmol/L (ref 135–145)

## 2022-02-02 LAB — CBC WITH DIFFERENTIAL/PLATELET
Abs Immature Granulocytes: 0.22 10*3/uL — ABNORMAL HIGH (ref 0.00–0.07)
Basophils Absolute: 0 10*3/uL (ref 0.0–0.1)
Basophils Relative: 0 %
Eosinophils Absolute: 0 10*3/uL (ref 0.0–0.5)
Eosinophils Relative: 0 %
HCT: 37.2 % — ABNORMAL LOW (ref 39.0–52.0)
Hemoglobin: 12.6 g/dL — ABNORMAL LOW (ref 13.0–17.0)
Immature Granulocytes: 1 %
Lymphocytes Relative: 9 %
Lymphs Abs: 2 10*3/uL (ref 0.7–4.0)
MCH: 31.1 pg (ref 26.0–34.0)
MCHC: 33.9 g/dL (ref 30.0–36.0)
MCV: 91.9 fL (ref 80.0–100.0)
Monocytes Absolute: 1.6 10*3/uL — ABNORMAL HIGH (ref 0.1–1.0)
Monocytes Relative: 7 %
Neutro Abs: 19.7 10*3/uL — ABNORMAL HIGH (ref 1.7–7.7)
Neutrophils Relative %: 83 %
Platelets: 304 10*3/uL (ref 150–400)
RBC: 4.05 MIL/uL — ABNORMAL LOW (ref 4.22–5.81)
RDW: 12.6 % (ref 11.5–15.5)
WBC: 23.6 10*3/uL — ABNORMAL HIGH (ref 4.0–10.5)
nRBC: 0 % (ref 0.0–0.2)

## 2022-02-02 MED ORDER — AMLODIPINE BESYLATE 5 MG PO TABS: 5.0000 mg | ORAL_TABLET | Freq: Every day | ORAL | 0 refills | Status: DC

## 2022-02-02 MED ORDER — DEXAMETHASONE 4 MG PO TABS: 4.0000 mg | ORAL_TABLET | Freq: Every day | ORAL | 0 refills | Status: AC

## 2022-02-02 MED ORDER — IBUPROFEN 400 MG PO TABS: 400.0000 mg | ORAL_TABLET | Freq: Four times a day (QID) | ORAL | Status: AC | PRN

## 2022-02-02 MED ORDER — LISINOPRIL 10 MG PO TABS: 10.0000 mg | ORAL_TABLET | Freq: Every day | ORAL | 0 refills | Status: DC

## 2022-02-02 MED ORDER — AMOXICILLIN-POT CLAVULANATE 875-125 MG PO TABS: 1.0000 | ORAL_TABLET | Freq: Two times a day (BID) | ORAL | 0 refills | Status: AC

## 2022-02-02 MED ORDER — SENNOSIDES-DOCUSATE SODIUM 8.6-50 MG PO TABS: 1.0000 | ORAL_TABLET | Freq: Two times a day (BID) | ORAL | 0 refills | Status: AC

## 2022-02-02 NOTE — Progress Notes (Signed)
Troy Wiley to be D/C'd per MD order. Discussed with the patient and all questions fully answered. ? VSS, Skin clean, dry and intact without evidence of skin break down. ? IV catheter discontinued intact. Site without signs and symptoms of complications. Dressing and pressure applied. ? An After Visit Summary was printed and given to the patient. Patient informed where to pickup prescriptions an to start Augmentin this morning and steroids to start tomorrow 9/11. ? D/c education completed with patient/family including follow up instructions, medication list, d/c activities limitations if indicated, with other d/c instructions as indicated by MD - patient able to verbalize understanding, all questions fully answered.  ? Patient instructed to return to ED, call 911, or call MD for any changes in condition.  ? Patient to be escorted via WC, and D/C home via private auto.
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
## 2022-02-05 LAB — CULTURE, BLOOD (ROUTINE X 2)
Culture: NO GROWTH
Culture: NO GROWTH
Special Requests: ADEQUATE
Special Requests: ADEQUATE

## 2022-04-01 ENCOUNTER — Emergency Department
Admission: EM | Admit: 2022-04-01 | Discharge: 2022-04-01 | Discharge status: Against Medical Advice | Payer: Medicare Other | Attending: Emergency Medicine | Admitting: Emergency Medicine

## 2022-04-01 ENCOUNTER — Other Ambulatory Visit: Payer: Self-pay

## 2022-04-01 DIAGNOSIS — F1020 Alcohol dependence, uncomplicated: Secondary | ICD-10-CM | POA: Diagnosis not present

## 2022-04-01 DIAGNOSIS — F1410 Cocaine abuse, uncomplicated: Secondary | ICD-10-CM | POA: Insufficient documentation

## 2022-04-01 DIAGNOSIS — I10 Essential (primary) hypertension: Secondary | ICD-10-CM | POA: Diagnosis not present

## 2022-04-01 DIAGNOSIS — F1910 Other psychoactive substance abuse, uncomplicated: Secondary | ICD-10-CM | POA: Diagnosis not present

## 2022-04-01 DIAGNOSIS — F1090 Alcohol use, unspecified, uncomplicated: Secondary | ICD-10-CM

## 2022-04-01 NOTE — ED Notes (Signed)
Pt requesting detox from cocaine and etoh use.  Last used yesterday.  Pt denies SI or HI.  Pt is homeless.  Pt denies any pain.  Pt alert.  Pt is calm and cooperative.  Dinner meal given to pt.

## 2022-04-01 NOTE — ED Triage Notes (Signed)
Patient arrived to ED via POV states he needs help and wants to detox from alcohol and cocaine. Reports drinking a six pack per day with last drink two days ago and last use of cocaine yesterday. Denies SI/HI. Denies pain, denies nausea or vomiting. Calm and cooperative during triage. Denies chest pain or shortness of breath.Denies headache, no tremor noted.
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
## 2024-05-03 ENCOUNTER — Encounter: Payer: Self-pay | Admitting: Emergency Medicine

## 2024-05-03 ENCOUNTER — Other Ambulatory Visit: Payer: Self-pay

## 2024-05-03 LAB — CBC WITH DIFFERENTIAL/PLATELET
Abs Immature Granulocytes: 0.06 K/uL (ref 0.00–0.07)
Basophils Absolute: 0.1 K/uL (ref 0.0–0.1)
Basophils Relative: 1 %
Eosinophils Absolute: 0.3 K/uL (ref 0.0–0.5)
Eosinophils Relative: 2 %
HCT: 45.3 % (ref 39.0–52.0)
Hemoglobin: 14.4 g/dL (ref 13.0–17.0)
Immature Granulocytes: 0 %
Lymphocytes Relative: 31 %
Lymphs Abs: 4.4 K/uL — ABNORMAL HIGH (ref 0.7–4.0)
MCH: 30.2 pg (ref 26.0–34.0)
MCHC: 31.8 g/dL (ref 30.0–36.0)
MCV: 95 fL (ref 80.0–100.0)
Monocytes Absolute: 1 K/uL (ref 0.1–1.0)
Monocytes Relative: 7 %
Neutro Abs: 8.3 K/uL — ABNORMAL HIGH (ref 1.7–7.7)
Neutrophils Relative %: 59 %
Platelets: 304 K/uL (ref 150–400)
RBC: 4.77 MIL/uL (ref 4.22–5.81)
RDW: 13.7 % (ref 11.5–15.5)
WBC: 14.1 K/uL — ABNORMAL HIGH (ref 4.0–10.5)
nRBC: 0 % (ref 0.0–0.2)

## 2024-05-03 LAB — URINALYSIS, ROUTINE W REFLEX MICROSCOPIC
Bilirubin Urine: NEGATIVE
Glucose, UA: NEGATIVE mg/dL
Hgb urine dipstick: NEGATIVE
Ketones, ur: NEGATIVE mg/dL
Leukocytes,Ua: NEGATIVE
Nitrite: NEGATIVE
Protein, ur: NEGATIVE mg/dL
Specific Gravity, Urine: 1.018 (ref 1.005–1.030)
pH: 6 (ref 5.0–8.0)

## 2024-05-03 MED ORDER — ONDANSETRON 4 MG PO TBDP
4.0000 mg | ORAL_TABLET | Freq: Once | ORAL | Status: AC
  Administered 2024-05-03: 4 mg via ORAL
  Filled 2024-05-03: qty 1

## 2024-05-03 MED ORDER — OXYCODONE-ACETAMINOPHEN 5-325 MG PO TABS
1.0000 | ORAL_TABLET | ORAL | Status: DC | PRN
  Administered 2024-05-03: 1 via ORAL
  Filled 2024-05-03: qty 1

## 2024-05-03 NOTE — ED Triage Notes (Signed)
 Patient ambulatory to triage with steady gait, without difficulty or distress noted; pt reports rt flank pain, nonradiating accomp by N/V x 2 days; denies hx of same; no meds taken PTA

## 2024-05-04 ENCOUNTER — Emergency Department

## 2024-05-04 ENCOUNTER — Emergency Department
Admission: EM | Admit: 2024-05-04 | Discharge: 2024-05-04 | Disposition: A | Attending: Emergency Medicine | Admitting: Emergency Medicine

## 2024-05-04 DIAGNOSIS — R10A1 Flank pain, right side: Secondary | ICD-10-CM | POA: Diagnosis not present

## 2024-05-04 LAB — COMPREHENSIVE METABOLIC PANEL WITH GFR
ALT: 22 U/L (ref 0–44)
AST: 28 U/L (ref 15–41)
Albumin: 4.6 g/dL (ref 3.5–5.0)
Alkaline Phosphatase: 60 U/L (ref 38–126)
Anion gap: 11 (ref 5–15)
BUN: 17 mg/dL (ref 6–20)
CO2: 24 mmol/L (ref 22–32)
Calcium: 9.7 mg/dL (ref 8.9–10.3)
Chloride: 104 mmol/L (ref 98–111)
Creatinine, Ser: 1.06 mg/dL (ref 0.61–1.24)
GFR, Estimated: >60 mL/min (ref >60)
Glucose, Bld: 99 mg/dL (ref 70–99)
Potassium: 4.4 mmol/L (ref 3.5–5.1)
Sodium: 139 mmol/L (ref 135–145)
Total Bilirubin: <0.2 mg/dL (ref 0.0–1.2)
Total Protein: 7.6 g/dL (ref 6.5–8.1)

## 2024-05-04 NOTE — ED Notes (Signed)
 Discharge paperwork reviewed. Verbalized understanding. Patient discharged home with self care at this time.

## 2024-05-04 NOTE — ED Notes (Addendum)
 Pt brought in triage for reassessment post fall in the lobby by EDT A Renaye; pt reports that he fell because of the pain in his back; pt denies any injuries or new symptoms; st persistent rt flank pain, nonradiating with no accomp symptoms

## 2024-05-04 NOTE — ED Notes (Addendum)
 Pt had a fall in ed lobby by pt restrooms and vending machines. Pt registration witnessed pt standing and being unsteady. Pt's Mother attempted to walk pt to restroom and pt stated Im fine. This tech went to get a wheelchair as pt continued to walk. When tech approached pt to sit in wheelchair pt was seen on the floor. Pt helped off the floor and placed in the wheelchair, pt brought to triage one to update pt's vitals.

## 2024-05-04 NOTE — Discharge Instructions (Addendum)
 Your workup in the Emergency Department today was reassuring.  We did not find any specific abnormalities.  We recommend you drink plenty of fluids, take your regular medications and/or any new ones prescribed today, and follow up with the doctor(s) listed in these documents as recommended.  Return to the Emergency Department if you develop new or worsening symptoms that concern you.

## 2024-05-04 NOTE — ED Notes (Signed)
 Patient endorsing flank pain rated as a 10/10 and continued nausea. Reports medication has not been effective thus far. Vitals obtained. States he fell out in the lobby due to the pain.

## 2024-05-04 NOTE — ED Provider Notes (Signed)
 [facility] Provider Note    Event Date/Time   First MD Initiated Contact with Patient 05/04/24 [phone]     (approximate)   History   Flank Pain   HPI Troy Wiley is a 53 y.o. male who presents for evaluation of pain for 1 to 2 days in his right flank that radiates down to the right part of his lower abdomen.  He said it is accompanied with nausea and vomiting and the pain is sharp and severe.  He had no specific injury or trauma.  Nothing particular made it better or worse but after he took some Percocet in triage he feels much better.  He is ambulating without difficulty.  He has had no pain when he urinates.  No chest pain or shortness of breath.     Physical Exam   Triage Vital Signs: ED Triage Vitals  Encounter Vitals Group     BP 05/03/24 2343 (!) 159/125     Girls Systolic BP Percentile --      Girls Diastolic BP Percentile --      Boys Systolic BP Percentile --      Boys Diastolic BP Percentile --      Pulse Rate 05/03/24 2343 98     Resp 05/03/24 2343 20     Temp 05/03/24 2343 98.6 F (37 C)     Temp Source 05/03/24 2343 Oral     SpO2 05/03/24 2343 100 %     Weight 05/03/24 2334 108.9 kg (240 lb)     Height 05/03/24 2334 1.753 m (5' 9)     Head Circumference --      Peak Flow --      Pain Score 05/03/24 2334 10     Pain Loc --      Pain Education --      Exclude from Growth Chart --     Most recent vital signs: Vitals:   05/04/24 0240 05/04/24 0317  BP: 96/61 113/63  Pulse: 66 71  Resp:  18  Temp: 97.7 F (36.5 C) 97.7 F (36.5 C)  SpO2: 100% 96%    General: Awake, no distress.  CV:  Good peripheral perfusion.  Resp:  Normal effort. Speaking easily and comfortably, no accessory muscle usage nor intercostal retractions.   Abd:  No distention.  No tenderness to palpation of the abdomen but some right flank tenderness to percussion.   ED Results / Procedures / Treatments   Labs (all labs ordered are listed, but only  abnormal results are displayed) Labs Reviewed  CBC WITH DIFFERENTIAL/PLATELET - Abnormal; Notable for the following components:      Result Value   WBC 14.1 (*)    Neutro Abs 8.3 (*)    Lymphs Abs 4.4 (*)    All other components within normal limits  URINALYSIS, ROUTINE W REFLEX MICROSCOPIC - Abnormal; Notable for the following components:   Color, Urine YELLOW (*)    APPearance CLEAR (*)    All other components within normal limits  COMPREHENSIVE METABOLIC PANEL WITH GFR      RADIOLOGY See ED course for details   PROCEDURES:  Critical Care performed: No  Procedures    IMPRESSION / MDM / ASSESSMENT AND PLAN / ED COURSE  I reviewed the triage vital signs and the nursing notes.  Differential diagnosis includes, but is not limited to, renal/ureteral colic, UTI/pyelonephritis, musculoskeletal strain, less likely intra-abdominal infection.  Patient's presentation is most consistent with acute presentation with potential threat to life or bodily function.  Labs/studies ordered: CMP, urinalysis, CBC with differential, CT renal stone study  Interventions/Medications given:  Medications  oxyCODONE -acetaminophen  (PERCOCET/ROXICET) 5-325 MG per tablet 1 tablet (1 tablet Oral Given 05/03/24 2339)  ondansetron  (ZOFRAN -ODT) disintegrating tablet 4 mg (4 mg Oral Given 05/03/24 2340)    (Note:  hospital course my include additional interventions and/or labs/studies not listed above.)   Vital signs normal.  Patient was initially in a lot of pain but reportedly passed out in the waiting room after he got Percocet.  He said he did not hit his head and he feels fine, in fact better than before he got the Percocet.  He is ambulatory without difficulty after having a long nap.  I am awaiting the results of his CT scan.  His lab work is generally reassuring other than a nonspecific leukocytosis.  Urinalysis is normal.     Clinical Course as of 05/04/24 0601   Wed May 04, 2024  0559 CT Renal [place] Study I independently viewed and interpreted the patient's abd/pelvis CT, as well as reviewing the radiologist's report.  I could not see any evidence of ureteral or renal stones and no other acute abnormalities.  Radiologist also identified no acute abnormalities. [CF]  0600 Patient is awake and alert, cheerful and conversant, sitting up on his bed.  His mother is at bedside.  I provided the reassuring findings and he is comfortable with the plan for discharge and outpatient follow-up.  The patient's medical screening exam is reassuring with no indication of an emergent medical condition requiring hospitalization or additional evaluation at this point.  The patient is safe and appropriate for discharge and outpatient follow up.  [CF]    Clinical Course User Index [CF] Gordan Huxley, MD     FINAL CLINICAL IMPRESSION(S) / ED DIAGNOSES   Final diagnoses:  Right flank pain     Rx / DC Orders   ED Discharge Orders     None        Note:  This document was prepared using Dragon voice recognition software and may include unintentional dictation errors.   Gordan Huxley, MD 05/04/24 [phone]

## 2024-06-08 ENCOUNTER — Ambulatory Visit: Admitting: Sleep Medicine

## 2024-06-16 ENCOUNTER — Ambulatory Visit: Admitting: Sleep Medicine

## 2024-06-16 ENCOUNTER — Encounter: Payer: Self-pay | Admitting: Sleep Medicine

## 2024-06-16 VITALS — BP 148/90 | HR 77 | Temp 98.0°F | Ht 69.0 in | Wt 240.8 lb

## 2024-06-16 DIAGNOSIS — F17210 Nicotine dependence, cigarettes, uncomplicated: Secondary | ICD-10-CM

## 2024-06-16 DIAGNOSIS — G4710 Hypersomnia, unspecified: Secondary | ICD-10-CM

## 2024-06-16 DIAGNOSIS — Z6835 Body mass index (BMI) 35.0-35.9, adult: Secondary | ICD-10-CM | POA: Diagnosis not present

## 2024-06-16 DIAGNOSIS — I10 Essential (primary) hypertension: Secondary | ICD-10-CM

## 2024-06-16 DIAGNOSIS — G4733 Obstructive sleep apnea (adult) (pediatric): Secondary | ICD-10-CM

## 2024-06-16 DIAGNOSIS — E669 Obesity, unspecified: Secondary | ICD-10-CM | POA: Diagnosis not present
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
## 2024-06-16 NOTE — Patient Instructions (Addendum)
 SABRA
